# Patient Record
Sex: Female | Born: 1955
Health system: Southern US, Community
[De-identification: ages and names within clinical notes are randomized; demographics above are authoritative.]

## PROBLEM LIST (undated history)

## (undated) DIAGNOSIS — K5792 Diverticulitis of intestine, part unspecified, without perforation or abscess without bleeding: Secondary | ICD-10-CM

## (undated) DIAGNOSIS — K219 Gastro-esophageal reflux disease without esophagitis: Secondary | ICD-10-CM

## (undated) DIAGNOSIS — K589 Irritable bowel syndrome without diarrhea: Secondary | ICD-10-CM

## (undated) DIAGNOSIS — Z9109 Other allergy status, other than to drugs and biological substances: Secondary | ICD-10-CM

## (undated) HISTORY — PX: CHOLECYSTECTOMY: SHX55

## (undated) HISTORY — PX: FRACTURE SURGERY: SHX138

## (undated) HISTORY — PX: TONSILLECTOMY: SUR1361

## (undated) HISTORY — PX: JOINT REPLACEMENT: SHX530

## (undated) HISTORY — PX: ABDOMINAL HYSTERECTOMY: SHX81

## (undated) HISTORY — PX: BLADDER SURGERY: SHX569

## (undated) HISTORY — DX: Irritable bowel syndrome, unspecified: K58.9

---

## 1998-07-19 ENCOUNTER — Inpatient Hospital Stay (HOSPITAL_COMMUNITY): Admission: EM | Admit: 1998-07-19 | Discharge: 1998-07-21 | Payer: Self-pay | Admitting: Internal Medicine

## 1999-08-22 ENCOUNTER — Encounter: Admission: RE | Admit: 1999-08-22 | Discharge: 1999-08-22 | Payer: Self-pay | Admitting: Otolaryngology

## 1999-12-22 ENCOUNTER — Encounter: Payer: Self-pay | Admitting: Internal Medicine

## 1999-12-22 ENCOUNTER — Ambulatory Visit (HOSPITAL_COMMUNITY): Admission: RE | Admit: 1999-12-22 | Discharge: 1999-12-22 | Payer: Self-pay | Admitting: Internal Medicine

## 2001-05-10 ENCOUNTER — Encounter: Admission: RE | Admit: 2001-05-10 | Discharge: 2001-05-10 | Payer: Self-pay | Admitting: Urology

## 2001-05-10 ENCOUNTER — Encounter: Payer: Self-pay | Admitting: Urology

## 2001-10-16 ENCOUNTER — Encounter: Payer: Self-pay | Admitting: Internal Medicine

## 2001-10-16 ENCOUNTER — Ambulatory Visit (HOSPITAL_COMMUNITY): Admission: RE | Admit: 2001-10-16 | Discharge: 2001-10-16 | Payer: Self-pay | Admitting: Internal Medicine

## 2001-11-08 ENCOUNTER — Ambulatory Visit (HOSPITAL_COMMUNITY): Admission: RE | Admit: 2001-11-08 | Discharge: 2001-11-08 | Payer: Self-pay | Admitting: Surgery

## 2001-11-08 ENCOUNTER — Encounter: Payer: Self-pay | Admitting: Surgery

## 2002-06-10 ENCOUNTER — Encounter: Payer: Self-pay | Admitting: Internal Medicine

## 2002-06-10 ENCOUNTER — Ambulatory Visit (HOSPITAL_COMMUNITY): Admission: RE | Admit: 2002-06-10 | Discharge: 2002-06-10 | Payer: Self-pay | Admitting: Internal Medicine

## 2002-06-13 ENCOUNTER — Ambulatory Visit (HOSPITAL_COMMUNITY): Admission: RE | Admit: 2002-06-13 | Discharge: 2002-06-13 | Payer: Self-pay | Admitting: Internal Medicine

## 2002-06-13 ENCOUNTER — Encounter: Payer: Self-pay | Admitting: Internal Medicine

## 2002-07-07 ENCOUNTER — Encounter: Payer: Self-pay | Admitting: Emergency Medicine

## 2002-07-07 ENCOUNTER — Emergency Department (HOSPITAL_COMMUNITY): Admission: EM | Admit: 2002-07-07 | Discharge: 2002-07-07 | Payer: Self-pay | Admitting: Emergency Medicine

## 2003-12-18 ENCOUNTER — Ambulatory Visit (HOSPITAL_COMMUNITY): Admission: RE | Admit: 2003-12-18 | Discharge: 2003-12-18 | Payer: Self-pay | Admitting: Specialist

## 2003-12-18 ENCOUNTER — Ambulatory Visit (HOSPITAL_BASED_OUTPATIENT_CLINIC_OR_DEPARTMENT_OTHER): Admission: RE | Admit: 2003-12-18 | Discharge: 2003-12-18 | Payer: Self-pay | Admitting: Specialist

## 2004-02-11 ENCOUNTER — Inpatient Hospital Stay (HOSPITAL_COMMUNITY): Admission: AD | Admit: 2004-02-11 | Discharge: 2004-02-17 | Payer: Self-pay | Admitting: Family Medicine

## 2004-02-15 ENCOUNTER — Encounter: Payer: Self-pay | Admitting: Internal Medicine

## 2004-02-16 ENCOUNTER — Encounter (INDEPENDENT_AMBULATORY_CARE_PROVIDER_SITE_OTHER): Payer: Self-pay | Admitting: Specialist

## 2004-02-22 ENCOUNTER — Encounter: Admission: RE | Admit: 2004-02-22 | Discharge: 2004-02-22 | Payer: Self-pay | Admitting: Family Medicine

## 2005-05-30 ENCOUNTER — Emergency Department (HOSPITAL_COMMUNITY): Admission: EM | Admit: 2005-05-30 | Discharge: 2005-05-30 | Payer: Self-pay | Admitting: Emergency Medicine

## 2007-12-24 ENCOUNTER — Emergency Department (HOSPITAL_COMMUNITY): Admission: EM | Admit: 2007-12-24 | Discharge: 2007-12-24 | Payer: Self-pay | Admitting: Emergency Medicine

## 2007-12-26 ENCOUNTER — Encounter (INDEPENDENT_AMBULATORY_CARE_PROVIDER_SITE_OTHER): Payer: Self-pay | Admitting: General Surgery

## 2007-12-26 ENCOUNTER — Observation Stay (HOSPITAL_COMMUNITY): Admission: EM | Admit: 2007-12-26 | Discharge: 2007-12-27 | Payer: Self-pay | Admitting: Emergency Medicine

## 2007-12-28 ENCOUNTER — Inpatient Hospital Stay (HOSPITAL_COMMUNITY): Admission: EM | Admit: 2007-12-28 | Discharge: 2007-12-31 | Payer: Self-pay | Admitting: Emergency Medicine

## 2008-10-27 ENCOUNTER — Emergency Department (HOSPITAL_COMMUNITY): Admission: EM | Admit: 2008-10-27 | Discharge: 2008-10-28 | Payer: Self-pay | Admitting: Emergency Medicine

## 2008-11-04 ENCOUNTER — Emergency Department (HOSPITAL_COMMUNITY): Admission: EM | Admit: 2008-11-04 | Discharge: 2008-11-05 | Payer: Self-pay | Admitting: Emergency Medicine

## 2009-11-12 ENCOUNTER — Encounter: Payer: Self-pay | Admitting: Internal Medicine

## 2009-11-19 ENCOUNTER — Encounter (INDEPENDENT_AMBULATORY_CARE_PROVIDER_SITE_OTHER): Payer: Self-pay | Admitting: *Deleted

## 2009-11-19 ENCOUNTER — Encounter: Admission: RE | Admit: 2009-11-19 | Discharge: 2009-11-19 | Payer: Self-pay | Admitting: Obstetrics & Gynecology

## 2009-11-26 ENCOUNTER — Encounter (INDEPENDENT_AMBULATORY_CARE_PROVIDER_SITE_OTHER): Payer: Self-pay | Admitting: *Deleted

## 2009-12-20 ENCOUNTER — Encounter: Admission: RE | Admit: 2009-12-20 | Discharge: 2009-12-20 | Payer: Self-pay | Admitting: Otolaryngology

## 2010-01-04 ENCOUNTER — Encounter (INDEPENDENT_AMBULATORY_CARE_PROVIDER_SITE_OTHER): Payer: Self-pay | Admitting: *Deleted

## 2010-01-04 ENCOUNTER — Ambulatory Visit: Payer: Self-pay | Admitting: Internal Medicine

## 2010-01-04 DIAGNOSIS — R109 Unspecified abdominal pain: Secondary | ICD-10-CM

## 2010-01-04 DIAGNOSIS — R198 Other specified symptoms and signs involving the digestive system and abdomen: Secondary | ICD-10-CM

## 2010-01-25 ENCOUNTER — Ambulatory Visit: Payer: Self-pay | Admitting: Internal Medicine

## 2010-01-26 ENCOUNTER — Telehealth: Payer: Self-pay | Admitting: Internal Medicine

## 2010-10-10 ENCOUNTER — Ambulatory Visit: Payer: Self-pay | Admitting: Family Medicine

## 2010-10-10 DIAGNOSIS — M25569 Pain in unspecified knee: Secondary | ICD-10-CM

## 2010-10-11 ENCOUNTER — Encounter: Payer: Self-pay | Admitting: Family Medicine

## 2010-10-15 ENCOUNTER — Encounter
Admission: RE | Admit: 2010-10-15 | Discharge: 2010-10-15 | Payer: Self-pay | Source: Home / Self Care | Attending: Family Medicine | Admitting: Family Medicine

## 2010-10-16 ENCOUNTER — Emergency Department (HOSPITAL_BASED_OUTPATIENT_CLINIC_OR_DEPARTMENT_OTHER)
Admission: EM | Admit: 2010-10-16 | Discharge: 2010-10-16 | Payer: Self-pay | Source: Home / Self Care | Admitting: Emergency Medicine

## 2010-10-20 ENCOUNTER — Ambulatory Visit
Admission: RE | Admit: 2010-10-20 | Discharge: 2010-10-20 | Payer: Self-pay | Source: Home / Self Care | Attending: Orthopedic Surgery | Admitting: Orthopedic Surgery

## 2010-11-25 ENCOUNTER — Encounter
Admission: RE | Admit: 2010-11-25 | Discharge: 2010-11-25 | Payer: Self-pay | Source: Home / Self Care | Attending: Orthopedic Surgery | Admitting: Orthopedic Surgery

## 2010-12-06 NOTE — Assessment & Plan Note (Signed)
Summary: RT KNEE PAIN/NP/LP   Vital Signs:  Patient profile:   55 year old female Height:      63.5 inches (161.29 cm) Weight:      129.4 pounds (58.82 kg) BMI:     22.64 Temp:     98.1 degrees F (36.72 degrees C) oral Pulse rate:   68 / minute BP sitting:   108 / 71  (right arm)  Vitals Entered By: Baxter Hire) (October 10, 2010 10:56 AM) CC: right knee pain Pain Assessment Patient in pain? yes     Location: right knee Intensity: 9 Type: aching and sharp Onset of pain  knee pain for the past 2 months Nutritional Status BMI of 19 -24 = normal  Does patient need assistance? Functional Status Self care Ambulation Normal   Primary Care Provider:  Levander Campion, MD  CC:  right knee pain.  History of Present Illness: 55 yo F here with right knee pain  Patient reports no known injury Pain started about 2 months ago in medial aspect of knee No issues with left knee Pain worse with twisting motions, sharp pains at times just in this locqation No true locking Clicking within knee Has buckled occasionally Had weight bearing x-rays which showed mild medial DJD Has tried tylenol (no nsaids - has gerd and worsen this and irritate stomach), icing without much help Had cortisone injection into knee as well which did not help either Occasionally wakes her up at night. No prior knee injuries or surgeries  Habits & Providers  Alcohol-Tobacco-Diet     Tobacco Status: quit  Problems Prior to Update: 1)  Knee Pain, Right  (ICD-719.46) 2)  Abdominal Pain, Upper  (ICD-789.09) 3)  Change in Bowels  (ZOX-096.04)  Allergies: 1)  ! Penicillin 2)  ! Sulfa 3)  ! Demerol  Past History:  Past Medical History: Last updated: 01/04/2010 Insomnia Allergic Rhinitis Hiatal Hernia Anxiety Disorder Depression GERD Hyperlipidemia  Family History: Last updated: 01/04/2010 Family History of Heart Disease: Brother-MI age 67, Father-MI age 20 Family History of Colon  Polyps:mother No FH of Colon Cancer: Family History of Lung Cancer: Maternal Aunt (smoker)  Physical Exam  General:  Well developed, well nourished, no acute distress. Msk:  R knee: No gross deformity, swelling, or bruising. Moderate TTP medial joint line.  No TTP lateral joint line, pes insertion, elsewhere about knee. FROM but pain at full flexion Negative ant/post drawer, lachmanns Knee stable to valgus/varus stress Negative patellar apprehension + mcmurrays medially NVI distally   Impression & Recommendations:  Problem # 1:  KNEE PAIN, RIGHT (ICD-719.46) Assessment New Knee pain worse with twisting without an injury and not relieved by cortisone injection as well as + mcmurrays suggest patient has a degenerative medial meniscal tear.  Given conservative therapy has not worked up to this point including injection, she will likely need surgical intervention if tear is confirmed by MRI.  Order MRI to assess which will be done Saturday.  Will call patient with results.  In meantime given rx for vicodin for severe pain but no driving on this.  Has very little change in joint space medially to suggest arthritis as primary cause and these were weight-bearing films.  Home quad strengthening exercises, handout provided.  Her updated medication list for this problem includes:    Tylenol Extra Strength 500 Mg Tabs (Acetaminophen) .Marland Kitchen... Take as needed    Vicodin 5-500 Mg Tabs (Hydrocodone-acetaminophen) .Marland Kitchen... 1 tab by mouth two times a day as needed  severe pain  Orders: MRI without Contrast (MRI w/o Contrast)  Complete Medication List: 1)  Flonase 50 Mcg/act Susp (Fluticasone propionate) .... 2 sprays each nostril daily 2)  Chantix Continuing Month Pak 1 Mg Tabs (Varenicline tartrate) .... Take one by mouth two times a day 3)  Ambien Cr 12.5 Mg Cr-tabs (Zolpidem tartrate) .... Take one by mouth at bedtime 4)  Omeprazole 40 Mg Cpdr (Omeprazole) .... Take 1 tablet by mouth once a day 5)   Celexa 40 Mg Tabs (Citalopram hydrobromide) .... Take one by mouth once daily 6)  Prednisone 20 Mg Tabs (Prednisone) .... Take one tablet by mouth three times a day 7)  Klonopin 0.5 Mg Tabs (Clonazepam) .... Take one by mouth two times a day 8)  Vitamin D (ergocalciferol) 50000 Unit Caps (Ergocalciferol) .... Take one by mouth once a week 9)  Sea-omega 30 1200 Mg Caps (Omega-3 fatty acids) .... Take one by mouth two times a day 10)  Tylenol Extra Strength 500 Mg Tabs (Acetaminophen) .... Take as needed 11)  Hyomax-sr 0.375 Mg Xr12h-tab (Hyoscyamine sulfate) .Marland Kitchen.. 1 by mouth two times a day 12)  Vicodin 5-500 Mg Tabs (Hydrocodone-acetaminophen) .Marland Kitchen.. 1 tab by mouth two times a day as needed severe pain  Patient Instructions: 1)  While you have some mild arthritis, your symptoms are most likely from a degenerative meniscal tear (cartilage tear). 2)  We need to do an MRI to confirm this - if there is one, I will have you see one of my colleagues to discuss surgery. 3)  Continue icing your knee 15 minutes at a time 3-4 times a day. 4)  Elevate above the level of your heart when possible. 5)  A knee sleeve may help for support. 6)  Take pain medication up to twice a day as directed but no driving or working on this medication. 7)  I will call you with the MRI result when it comes back and how we are to proceed. Prescriptions: VICODIN 5-500 MG TABS (HYDROCODONE-ACETAMINOPHEN) 1 tab by mouth two times a day as needed severe pain  #30 x 0   Entered and Authorized by:   Norton Blizzard MD   Signed by:   Norton Blizzard MD on 10/10/2010   Method used:   Print then Give to Patient   RxID:   [id number]    Orders Added: 1)  MRI without Contrast [MRI w/o Contrast] 2)  New Patient Level III [[address]782]

## 2010-12-06 NOTE — Letter (Signed)
Summary: *Consult Note  [facility]  [address]   [place], [place] [address]   Phone: [phone]  Fax:     Re:    [name] DOB:    [birth year]   Dear Dr Luz Brazen:    Thank you for requesting that we see the above patient for consultation.  A copy of the detailed office note will be sent under separate cover, for your review.  Evaluation today is consistent with:  Right knee degenerative medial meniscal tear  Our recommendation is for:  MRI of knee given cortisone injection, tylenol, and icing has not helped to this point as would expect it to with arthritis Continue with icing Vicodin two times a day as needed severe pain Will call patient with MRI result - if tear confirmed, will refer for consideration of arthroscopy   After today's visit, the patients current medications include: 1)  FLONASE 50 MCG/ACT SUSP (FLUTICASONE PROPIONATE) 2 sprays each nostril daily 2)  CHANTIX CONTINUING MONTH PAK 1 MG TABS (VARENICLINE TARTRATE) take one by mouth two times a day 3)  AMBIEN CR 12.5 MG CR-TABS (ZOLPIDEM TARTRATE) take one by mouth at bedtime 4)  OMEPRAZOLE 40 MG CPDR (OMEPRAZOLE) Take 1 tablet by mouth once a day 5)  CELEXA 40 MG TABS (CITALOPRAM HYDROBROMIDE) take one by mouth once daily 6)  PREDNISONE 20 MG TABS (PREDNISONE) take one tablet by mouth three times a day 7)  KLONOPIN 0.5 MG TABS (CLONAZEPAM) take one by mouth two times a day 8)  VITAMIN D (ERGOCALCIFEROL) 50000 UNIT CAPS (ERGOCALCIFEROL) take one by mouth once a week 9)  SEA-OMEGA 30 1200 MG CAPS (OMEGA-3 FATTY ACIDS) take one by mouth two times a day 10)  TYLENOL EXTRA STRENGTH 500 MG TABS (ACETAMINOPHEN) take as needed 11)  HYOMAX-SR 0.375 MG XR12H-TAB (HYOSCYAMINE SULFATE) 1 by mouth two times a day 12)  VICODIN 5-500 MG TABS (HYDROCODONE-ACETAMINOPHEN) 1 tab by mouth two times a day as needed severe pain   Thank you for this consultation.  If you have any further questions regarding  the care of this patient, please do not hesitate to contact me @ [phone].  Thank you for this opportunity to look after your patient.  Sincerely,   Norton Blizzard MD

## 2010-12-06 NOTE — Letter (Signed)
Summary: New Patient letter  [facility]  [address] [place], [place] [address]   Phone: [phone]  Fax: [phone]       11/26/2009 MRN: [id number]  [facility] [place] [address] [place], [place]  [address]  Dear Ms. Reffett,  Welcome to the Gastroenterology Division at [facility].    You are scheduled to see Dr. Leone Payor on 01-04-10 at 8:45a.m. on the 3rd floor at [facility], [address]  We ask that you try to arrive at our office 15 minutes prior to your appointment time to allow for check-in.  We would like you to complete the enclosed self-administered evaluation form prior to your visit and bring it with you on the day of your appointment.  We will review it with you.  Also, please bring a complete list of all your medications or, if you prefer, bring the medication bottles and we will list them.  Please bring your insurance card so that we may make a copy of it.  If your insurance requires a referral to see a specialist, please bring your referral form from your primary care physician.  Co-payments are due at the time of your visit and may be paid by cash, check or credit card.     Your office visit will consist of a consult with your physician (includes a physical exam), any laboratory testing he/she may order, scheduling of any necessary diagnostic testing (e.g. x-ray, ultrasound, CT-scan), and scheduling of a procedure (e.g. Endoscopy, Colonoscopy) if required.  Please allow enough time on your schedule to allow for any/all of these possibilities.    If you cannot keep your appointment, please call [phone] to cancel or reschedule prior to your appointment date.  This allows [place] the opportunity to schedule an appointment for another patient in need of care.  If you do not cancel or reschedule by 5 p.m. the business day prior to your appointment date, you will be charged a $50.00 late cancellation/no-show fee.    Thank you for choosing  [facility] for your medical needs.  We appreciate the opportunity to care for you.  Please visit [place] at our website  to learn more about our practice.                     Sincerely,                                                             The Gastroenterology Division

## 2010-12-06 NOTE — Procedures (Signed)
Summary: Colonoscopy: Dr. Randa Evens   Colonoscopy  Procedure date:  02/15/2004  Findings:      Location:  [facility]  Performed by Dr. Carman Ching  NAME:  [name], [name]                            ACCOUNT NO.:  [id number]   MEDICAL RECORD NO.:  [id number]                   PATIENT TYPE:  INP   LOCATION:  5731                                 FACILITY:  [facility]   PHYSICIAN:  Llana Aliment. Malon Kindle., M.D.          DATE OF BIRTH:  04/03/1956   DATE OF PROCEDURE:  02/15/2004  DATE OF DISCHARGE:                                 OPERATIVE REPORT   PROCEDURE:  Colonoscopy and biopsy.   MEDICATIONS:  Fentanyl 90 mcg, Versed 9 mg IV.   INDICATIONS:  Abdominal pain, diarrhea, with CT scan showing a possible  thickening of the sigmoid colon.   DESCRIPTION OF PROCEDURE:  The patient had been prepped with some Miralax  and tap water enemas.  The scope was inserted and we advanced easily to the  cecum.  The ileocecal valve and appendiceal orifice were seen.  There was no  solid stool.  The mucosa throughout the colon revealed some diffuse edema  and granularity, in some places worse than others.  Multiple biopsies were  obtained.  It did seem a little bit friable.  There were no ulcerations or  inflammation.  This process actually extended throughout the entire colon.  There were no other findings in the sigmoid colon, no significant  diverticular disease.  The rectum was free of lesions.  There were no polyps  seen.   ASSESSMENT:  Possible mild colitis of questionable cause.   PLAN:  Will check the results of all the studies to date, including  pathology reports of these biopsies.  Will go ahead and feed the patient and  see how she does clinically.                                               James L. Malon Kindle., M.D.    [name]  D:  02/15/2004  T:  02/16/2004  Job:  [id number]   cc:   Leighton Roach McDiarmid, M.D.  Fax: [phone]  This report was created from the original  endoscopy report, which was reviewed and signed by the above listed endoscopist.

## 2010-12-06 NOTE — Letter (Signed)
Summary: New Patient letter  [facility]  [address] [place], [place] [address]   Phone: [phone]  Fax: [phone]       11/19/2009 MRN: [id number]  [facility] [place] [address] [place], [place]  [address]  Dear Ms. Sedlacek,  Welcome to the Gastroenterology Division at [facility].    You are scheduled to see Dr. Leone Payor on 12-14-09 at 10:30a.m. on the 3rd floor at [facility], [address].  We ask that you try to arrive at our office 15 minutes prior to your appointment time to allow for check-in.  We would like you to complete the enclosed self-administered evaluation form prior to your visit and bring it with you on the day of your appointment.  We will review it with you.  Also, please bring a complete list of all your medications or, if you prefer, bring the medication bottles and we will list them.  Please bring your insurance card so that we may make a copy of it.  If your insurance requires a referral to see a specialist, please bring your referral form from your primary care physician.  Co-payments are due at the time of your visit and may be paid by cash, check or credit card.     Your office visit will consist of a consult with your physician (includes a physical exam), any laboratory testing he/she may order, scheduling of any necessary diagnostic testing (e.g. x-ray, ultrasound, CT-scan), and scheduling of a procedure (e.g. Endoscopy, Colonoscopy) if required.  Please allow enough time on your schedule to allow for any/all of these possibilities.    If you cannot keep your appointment, please call [phone] to cancel or reschedule prior to your appointment date.  This allows [place] the opportunity to schedule an appointment for another patient in need of care.  If you do not cancel or reschedule by 5 p.m. the business day prior to your appointment date, you will be charged a $50.00 late cancellation/no-show fee.    Thank you for  choosing [facility] for your medical needs.  We appreciate the opportunity to care for you.  Please visit [place] at our website  to learn more about our practice.                     Sincerely,                                                             The Gastroenterology Division

## 2010-12-06 NOTE — Progress Notes (Signed)
Summary: triage  Phone Note Call from Patient Call back at Home Phone [phone]   Caller: Patient Call For: Leone Payor Reason for Call: Talk to Nurse Summary of Call: Patient wants to speak to nurse because she is having a lot of problems with her stomach, states that she had a meal last night and feels really bad after that. Initial call taken by: Tawni Levy,  January 26, 2010 10:22 AM  Follow-up for Phone Call        P t called stating she has felt "bad" since eating after procedure. Has a burning type pain- a little crampy. When asked she had a chickfila sandwick with french fries after procedure. She has had a banana this am and was unsure if she felt better. Refered to diet suggestions on D/c sheet and encouraged her to make wise choices- if not better after next meal to call us back Follow-up by: Oda Cogan RN,  January 26, 2010 2:20 PM

## 2010-12-06 NOTE — Procedures (Signed)
Summary: Upper Endoscopy  Patient: [name] Note: All result statuses are Final unless otherwise noted.  Tests: (1) Upper Endoscopy (EGD)   EGD Upper Endoscopy       DONE     [facility]     [address]     [place], [place]  [address]           ENDOSCOPY PROCEDURE REPORT           PATIENT:  [name], [name]  MR#:  [id number]     BIRTHDATE:  June 13, 1956, 53 yrs. old  GENDER:  female           ENDOSCOPIST:  Iva Boop, MD, [facility]     Referred by:  Levander Campion, M.D.           PROCEDURE DATE:  01/25/2010     PROCEDURE:  EGD with biopsy     ASA CLASS:  Class II     INDICATIONS:  abdominal pain, multiple sites upper           MEDICATIONS:   Benadryl 25 mg IV, Versed 8 mg IV, Fentanyl 100 mcg     IV     TOPICAL ANESTHETIC:  Exactacain Spray           DESCRIPTION OF PROCEDURE:   After the risks benefits and     alternatives of the procedure were thoroughly explained, informed     consent was obtained.  The LB GIF-H180 [id number] endoscope was     introduced through the mouth and advanced to the second portion of     the duodenum, without limitations.  The instrument was slowly     withdrawn as the mucosa was fully examined.     <<PROCEDUREIMAGES>>           Mild gastritis was found. Erythema and mottled mucosa in the     antrum. Multiple biopsies were obtained and sent to pathology.     Otherwise the examination was normal.    Retroflexed views revealed     no abnormalities.    The scope was then withdrawn from the patient     and the procedure completed.           COMPLICATIONS:  None           ENDOSCOPIC IMPRESSION:     1) Mild gastritis - biopsied     2) Otherwise normal examination     RECOMMENDATIONS:     1) Await biopsy results     2) Colonoscopy next           REPEAT EXAM:  as needed           Iva Boop, MD, [facility]           CC:  Levander Campion MD, Annamaria Helling, MD, The Patient           n.     Rosalie Doctor:   Iva Boop at 01/25/2010 03:36  PM           Alva Garnet, [id number]  Note: An exclamation mark (!) indicates a result that was not dispersed into the flowsheet. Document Creation Date: 01/25/2010 3:37 PM _______________________________________________________________________  (1) Order result status: Final Collection or observation date-time: 01/25/2010 15:05 Requested date-time:  Receipt date-time:  Reported date-time:  Referring Physician:   Ordering Physician: Stan Head [phone]) Specimen Source:  Source: Launa Grill Order Number: [id number] Lab site:

## 2010-12-06 NOTE — Procedures (Signed)
Summary: Colonoscopy  Patient: [name] Note: All result statuses are Final unless otherwise noted.  Tests: (1) Colonoscopy (COL)   COL Colonoscopy           DONE     [facility]     [address]     [place], [place]  [address]           COLONOSCOPY PROCEDURE REPORT           PATIENT:  Kristin, Caldwell  MR#:  [id number]     BIRTHDATE:  1956/05/21, 53 yrs. old  GENDER:  female           ENDOSCOPIST:  Iva Boop, MD, [facility]     Referred by:  Levander Campion, M.D.           PROCEDURE DATE:  01/25/2010     PROCEDURE:  Colonoscopy 213[address]     ASA CLASS:  Class II     INDICATIONS:  change in bowel habits           MEDICATIONS:   There was residual sedation effect present from     prior procedure., Versed 2 mg IV           DESCRIPTION OF PROCEDURE:   After the risks benefits and     alternatives of the procedure were thoroughly explained, informed     consent was obtained.  Digital rectal exam was performed and     revealed no abnormalities.   The LB CF-H180AL [id number] endoscope     was introduced through the anus and advanced to the cecum, which     was identified by both the appendix and ileocecal valve, without     limitations.  The quality of the prep was adequate, using     MoviPrep.  The instrument was then slowly withdrawn as the colon     was fully examined.     Insertion: 5:49 minutes Withdrawal: 15:41 minutes     <<PROCEDUREIMAGES>>           FINDINGS:  Moderate diverticulosis was found in the sigmoid colon.     This was otherwise a normal examination of the colon.     Retroflexed views in the rectum revealed no abnormalities.    The     scope was then withdrawn from the patient and the procedure     completed.           COMPLICATIONS:  None           ENDOSCOPIC IMPRESSION:     1) Moderate diverticulosis in the sigmoid colon     2) Otherwise normal examination     RECOMMENDATIONS:     Continue MiraLax and use every day if needed to provide regular  bowel movements, adjusting dose for effect (example 1/2 dose daily     or 2 doses daily to provide desired effects)           I will notify her re: EGD biospies and plans.           REPEAT EXAM:  In 10 year(s) for routine screening colonocsopy.           Iva Boop, MD, [facility]           CC:  Levander Campion MD, Annamaria Helling, MD, The Patient           n.     Rosalie Doctor:   Iva Boop at 01/25/2010 03:44 PM  Ltanya, [place], [phone]  Note: An exclamation mark (!) indicates a result that was not dispersed into the flowsheet. Document Creation Date: 01/25/2010 3:45 PM _______________________________________________________________________  (1) Order result status: Final Collection or observation date-time: 01/25/2010 15:32 Requested date-time:  Receipt date-time:  Reported date-time:  Referring Physician:   Ordering Physician: Stan Head [phone]) Specimen Source:  Source: Launa Grill Order Number: [id number] Lab site:   Appended Document: Colonoscopy    Clinical Lists Changes  Observations: Added new observation of COLONNXTDUE: 01/2020 (01/25/2010 16:07)

## 2010-12-06 NOTE — Assessment & Plan Note (Signed)
Summary: Change in bowels, upper abdominal pain   History of Present Illness Visit Type: Initial Consult Primary GI MD: Stan Head MD [facility] Primary Provider: Levander Campion, MD Requesting Provider: Levander Campion, MD Chief Complaint: Patient referred for consult for a colonoscopy, she is having come constipation which she relates to her Prednisone. She states she has some hemorrhoids but denies any blood in her stool.  History of Present Illness:   55 yo white woman that has noticed a change in her bowels with constipation since she quit somking. May be mre narrow. PCP reocmmended MiraLax and it helped using it as needed once every 3-4 days. She was defecating every day then went to every 3-4 days since quitting smoking. + associated bloating, gets relief with defecation. When she strains a hemorrhoid swells.   She has had belching, bloating and red meat intolerance (pain in upper abdomen) since cholecystectomy 3 years ago. Spicy foods also give her abdomnal pain ("I will wind up in emergency room". Was on Nexium with help but not not total relief and went to omeprazole due to cost. Uppper abdomnal pain and tendeernes since chole.  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 MRI negative. Seeing Dr. Pollyann Kennedy. Has been on Prednisone since 2/18 20 mg three times a day.      GI Review of Systems    Reports acid reflux, belching, and  bloating.      Denies abdominal pain, chest pain, dysphagia with liquids, dysphagia with solids, heartburn, loss of appetite, nausea, vomiting, vomiting blood, weight loss, and  weight gain.      Reports change in bowel habits, constipation, and  hemorrhoids.    Preventive Screening-Counseling & Management  Alcohol-Tobacco     Smoking Status: quit    Current Medications (verified): 1)  Flonase 50 Mcg/act Susp (Fluticasone Propionate) .... 2 Sprays Each Nostril Daily 2)  Chantix Continuing Month Pak 1 Mg Tabs (Varenicline Tartrate)  .... Take One By Mouth Two Times A Day 3)  Ambien Cr 12.5 Mg Cr-Tabs (Zolpidem Tartrate) .... Take One By Mouth At Bedtime 4)  Omeprazole 40 Mg Cpdr (Omeprazole) .... Take 1 Tablet By Mouth Once A Day 5)  Celexa 40 Mg Tabs (Citalopram Hydrobromide) .... Take One By Mouth Once Daily 6)  Prednisone 20 Mg Tabs (Prednisone) .... Take One Tablet By Mouth Three Times A Day 7)  Klonopin 0.5 Mg Tabs (Clonazepam) .... Take One By Mouth Two Times A Day 8)  Vitamin D (Ergocalciferol) 50000 Unit Caps (Ergocalciferol) .... Take One By Mouth Once A Week 9)  Sea-Omega 30 1200 Mg Caps (Omega-3 Fatty Acids) .... Take One By Mouth Two Times A Day 10)  Tylenol Extra Strength 500 Mg Tabs (Acetaminophen) .... Take As Needed  Allergies (verified): 1)  ! Penicillin 2)  ! Sulfa 3)  ! Demerol  Past History:  Past Medical History: Insomnia Allergic Rhinitis Hiatal Hernia Anxiety Disorder Depression GERD Hyperlipidemia  Past Surgical History: Ankle Reconstruction Back Surgery BSO Cholecystectomy - stones Hysterectomy Tonsillectomy  Family History: Family History of Heart Disease: Brother-MI age 6, Father-MI age 29 Family History of Colon Polyps:mother No FH of Colon Cancer: Family History of Lung Cancer: Maternal Aunt (smoker)  Social History: Single, 2 Children Alcohol Use - yes 1 glass wine/week Illicit Drug Use - no Patient does not get regular exercise.  Patient is a former smoker.  Daily Caffeine Use couple cups coffee Smoking Status:  quit  Review of Systems  The patient complains of allergy/sinus, back pain, headaches-new, hearing problems, night sweats, sleeping problems, and urine leakage.         chronic anxiety adn panic, depression All other ROS negative except as per HPI.   Vital Signs:  Patient profile:   55 year old female Height:      63.5 inches Weight:      123.6 pounds BMI:     21.63 Pulse rate:   68 / minute Pulse rhythm:   regular BP sitting:   108 /  58  (left arm) Cuff size:   regular  Vitals Entered By: Harlow Mares CMA Duncan Dull) (January 04, 2010 8:38 AM)  Physical Exam  General:  Well developed, well nourished, no acute distress. Eyes:  PERRLA, no icterus. Mouth:  No deformity or lesions, dentition normal. Neck:  Supple; no masses or thyromegaly. Lungs:  Clear throughout to auscultation. Heart:  Regular rate and rhythm; no murmurs, rubs,  or bruits. Abdomen:  mildly tender RUQ, soft and NT otherwise without HSM or mass Rectal:  deferred until time of colonoscopy.   Extremities:  No clubbing, cyanosis, edema or deformities noted. Skin:  tattoos lower extremities Cervical Nodes:  No significant cervical or supraclavicular adenopathy.  Psych:  Alert and cooperative. Normal mood and affect.   Impression & Recommendations:  Problem # 1:  CHANGE IN BOWELS (UJW-119.14) Assessment New New constipation that she associates with quiting smoking, at least temporally. She had a colonoscopy with random bxcs for abdominal painand thickened sigmoid colon on CT([facility]) 2005 that showed edema and granularity of colonic mucosa but biopsies did not reveal a disease provess.This bowel habit changeneeds further evaluation and a diagnostic colonoscopy is indicated, which I have explained (as  opposed to screening). ? new mass lesion, ? diverticulosis, ? IBS I have reviewed Dr. Myna Hidalgo office notes from recent visit. Risks, benefits,and indications of endoscopic procedure(s) were reviewed with the patient and all questions answered.  colonoscopy  Orders: Colon/Endo (Colon/Endo)  Problem # 2:  ABDOMINAL PAIN, UPPER (ICD-789.09) Assessment: New Her upper abdominal pain could be GERD but has not reall;y responded to PPI and may be worse or different since cholecystectomy. Also tender over the RUQ area. EGD is appropriate to assess. Pending those results consider a trial of pancreatic enzyme supplements (? fat intolerance), or  anti-spasmodics. Risks, benefits,and indications of endoscopic procedure(s) were reviewed with the patient and all questions answered.  Orders: Colon/Endo (Colon/Endo)  Patient Instructions: 1)  We will see you at your procedure on  01/25/10. 2)  Please pick up your medications at your pharmacy. MOVIPREP 3)  [facility] Patient Information Guide given to patient.  4)  Colonoscopy and Flexible Sigmoidoscopy brochure given.  5)  Upper Endoscopy brochure given.  6)  Copy sent to : Annamaria Helling, MD, Levander Campion, MD 7)  The medication list was reviewed and reconciled.  All changed / newly prescribed medications were explained.  A complete medication list was provided to the patient / caregiver. Prescriptions: MOVIPREP 100 GM  SOLR (PEG-KCL-NACL-NASULF-NA ASC-C) As per prep instructions.  #1 x 0   Entered by:   Francee Piccolo CMA (AAMA)   Authorized by:   Iva Boop MD, [facility]   Signed by:   Francee Piccolo CMA (AAMA) on 01/04/2010   Method used:   Electronically to        [facility] [address] (retail)       [phone]       [place]  [place], [place]  [address]       Ph: [phone]       Fax: [phone]   RxID:   [id number]

## 2010-12-06 NOTE — Letter (Signed)
Summary: Levander Campion MD  Levander Campion MD   Imported By: Lester Troy 01/11/2010 10:56:53  _____________________________________________________________________  External Attachment:    Type:   Image     Comment:   External Document

## 2010-12-06 NOTE — Letter (Signed)
Summary: [facility] Instructions  [facility]  [address] [place], [place] [address]   Phone: [phone]  Fax: [phone]       MYSTIQUE BJELLAND    Sep 20, 1956    MRN: [id number]        Procedure Day Dorna Bloom: Jake Shark, 01/25/10     Arrival Time: 1:00 PM      Procedure Time: 2:00 PM     Location of Procedure:                    _X_  [facility] (4th Floor)                       PREPARATION FOR COLONOSCOPY WITH MOVIPREP   Starting 5 days prior to your procedure 01/20/10 do not eat nuts, seeds, popcorn, corn, beans, peas,  salads, or any raw vegetables.  Do not take any fiber supplements (e.g. Metamucil, Citrucel, and Benefiber).  THE DAY BEFORE YOUR PROCEDURE         DATE: 01/24/10       DAY: MONDAY  1.  Drink clear liquids the entire day-NO SOLID FOOD  2.  Do not drink anything colored red or purple.  Avoid juices with pulp.  No orange juice.  3.  Drink at least 64 oz. (8 glasses) of fluid/clear liquids during the day to prevent dehydration and help the prep work efficiently.  CLEAR LIQUIDS INCLUDE: Water Jello Ice Popsicles Tea (sugar ok, no milk/cream) Powdered fruit flavored drinks Coffee (sugar ok, no milk/cream) Gatorade Juice: apple, white grape, white cranberry  Lemonade Clear bullion, consomm, broth Carbonated beverages (any kind) Strained chicken noodle soup Hard Candy                             4.  In the morning, mix first dose of MoviPrep solution:    Empty 1 Pouch A and 1 Pouch B into the disposable container    Add lukewarm drinking water to the top line of the container. Mix to dissolve    Refrigerate (mixed solution should be used within 24 hrs)  5.  Begin drinking the prep at 5:00 p.m. The MoviPrep container is divided by 4 marks.   Every 15 minutes drink the solution down to the next mark (approximately 8 oz) until the full liter is complete.   6.  Follow completed prep with 16 oz of clear liquid of your choice (Nothing  red or purple).  Continue to drink clear liquids until bedtime.  7.  Before going to bed, mix second dose of MoviPrep solution:    Empty 1 Pouch A and 1 Pouch B into the disposable container    Add lukewarm drinking water to the top line of the container. Mix to dissolve    Refrigerate  THE DAY OF YOUR PROCEDURE      DATE: 01/25/10      DAY: TUESDAY  Beginning at 9:00 a.m. (5 hours before procedure):         1. Every 15 minutes, drink the solution down to the next mark (approx 8 oz) until the full liter is complete.  2. Follow completed prep with 16 oz. of clear liquid of your choice.    3. You may drink clear liquids until 12:00 NOON (2 HOURS BEFORE PROCEDURE).   MEDICATION INSTRUCTIONS  Unless otherwise instructed, you should take regular prescription medications with a small sip of water  as early as possible the morning of your procedure.  Diabetic patients - see separate instructions.  Stop taking Plavix or Aggrenox on  _  _  (7 days before procedure).     Stop taking Coumadin on  _ _  (5 days before procedure).  Additional medication instructions: _         OTHER INSTRUCTIONS  You will need a responsible adult at least 56 years of age to accompany you and drive you home.   This person must remain in the waiting room during your procedure.  Wear loose fitting clothing that is easily removed.  Leave jewelry and other valuables at home.  However, you may wish to bring a book to read or  an iPod/MP3 player to listen to music as you wait for your procedure to start.  Remove all body piercing jewelry and leave at home.  Total time from sign-in until discharge is approximately 2-3 hours.  You should go home directly after your procedure and rest.  You can resume normal activities the  day after your procedure.    The day of your procedure you should not:   Drive   Make legal decisions   Operate machinery   Drink alcohol   Return to work  You will  receive specific instructions about eating, activities and medications before you leave.    The above instructions have been reviewed and explained to me by   [facility], CMA    I fully understand and can verbalize these instructions _____________________________ Date _________

## 2011-01-16 LAB — POCT HEMOGLOBIN-HEMACUE: Hemoglobin: 13.4 g/dL (ref 12.0–15.0)

## 2011-01-22 ENCOUNTER — Emergency Department (INDEPENDENT_AMBULATORY_CARE_PROVIDER_SITE_OTHER): Payer: Managed Care, Other (non HMO)

## 2011-01-22 ENCOUNTER — Emergency Department (HOSPITAL_BASED_OUTPATIENT_CLINIC_OR_DEPARTMENT_OTHER)
Admission: EM | Admit: 2011-01-22 | Discharge: 2011-01-22 | Disposition: A | Discharge status: Home / Self Care | Payer: Managed Care, Other (non HMO) | Attending: Emergency Medicine | Admitting: Emergency Medicine

## 2011-01-22 DIAGNOSIS — M545 Low back pain, unspecified: Secondary | ICD-10-CM | POA: Insufficient documentation

## 2011-01-22 DIAGNOSIS — K219 Gastro-esophageal reflux disease without esophagitis: Secondary | ICD-10-CM | POA: Insufficient documentation

## 2011-01-22 DIAGNOSIS — M549 Dorsalgia, unspecified: Secondary | ICD-10-CM

## 2011-01-22 DIAGNOSIS — M79609 Pain in unspecified limb: Secondary | ICD-10-CM | POA: Insufficient documentation

## 2011-01-22 DIAGNOSIS — E785 Hyperlipidemia, unspecified: Secondary | ICD-10-CM | POA: Insufficient documentation

## 2011-01-22 LAB — URINALYSIS, ROUTINE W REFLEX MICROSCOPIC
Bilirubin Urine: NEGATIVE
Glucose, UA: NEGATIVE mg/dL
Hgb urine dipstick: NEGATIVE
Ketones, ur: NEGATIVE mg/dL
Specific Gravity, Urine: 1.014 (ref 1.005–1.030)
Urobilinogen, UA: 0.2 mg/dL (ref 0.0–1.0)
pH: 7.5 (ref 5.0–8.0)

## 2011-03-21 NOTE — Op Note (Signed)
NAMENOHEA, KRAS                  ACCOUNT NO.:  [id number]   MEDICAL RECORD NO.:  [id number]          PATIENT TYPE:  OBV   LOCATION:  0098                         FACILITY:  [facility]   PHYSICIAN:  Anselm Pancoast. Weatherly, M.D.DATE OF BIRTH:  1956-11-06   DATE OF PROCEDURE:  12/26/2007  DATE OF DISCHARGE:  12/27/2007                               OPERATIVE REPORT   PREOPERATIVE DIAGNOSIS:  Chronic cholecystitis with stones.   POSTOPERATIVE DIAGNOSIS:  Chronic cholecystitis with stones.   OPERATION:  Laparoscopic cholecystectomy with cholangiogram.   ANESTHESIA:  General anesthesia.   SURGEON:  Anselm Pancoast. Zachery Dakins, M.D.   ASSISTANT:  Leonie Man, M.D.   HISTORY:  [name] is a 55 year old female who has had recurrent  episodes of upper abdominal pain, was seen in the emergency room two  nights ago at [facility].  Her mother was hospitalized at the  time with end stage emphysema. Her mother died yesterday.  She was  recommended to see about having something done but because of her  mother's deteriorating condition, she declined, but pain became more  intense today and she came to the emergency room here. She is not  febrile, her white count is 5000, and she does appear uncomfortable.  She is on Vicodin and Phenergan.  I was asked to see her and talked with  the OR and elected to go ahead and proceed with surgery tonight.  Her  mother's funeral is on Saturday.  The patient was in agreement with this  plan.   DESCRIPTION OF PROCEDURE:  She was given 400 mg Cipro and emergent  penicillin and taken to the operative suite.  She had EKG and chest x-  ray which were reviewed by the anesthesiologist.  The abdomen was kind  of scaphoid. She had endotracheal tube and oral tube to the stomach.  The abdomen was prepped with Betadine solution and draped in a sterile  manner.  A small incision was made below the umbilicus.  She had a  little transverse incision made vertically and  the fascia was picked up  and then we carefully entered, picking up the posterior rectus fascia  which was picked up and a pursestring suture placed through both layers.  A Hassan cannula was introduced.  The gallbladder was distended but it  was not acutely inflamed.  There were adhesions around it.  The upper 10  mm trocar was placed in the subxiphoid area.  Dr. Lurene Shadow placed the  lateral 5 mm trocars at the appropriate lateral position and this is  right above adhesions from her previous sympathectomy.  The gallbladder  was then retracted upward and the adhesions with the omentum were taken  down carefully and then the proximal portion of the gallbladder was  identified.  The artery was first dissected from the cystic duct, doubly  clipped proximally, singly distally, and divided and I encompassed the  cystic duct where it junctioned with the gallbladder.  I placed a clip  across this junction and a small opening made proximally.  A Cook  catheter was introduced and held  in place with a clip. The x-ray was  obtained and it was kind of a dilated common bile duct but we could see  a good tapering flow going into the duodenum and I expect this is  related the narcotics she has received over the last 48 hours.  The  radiologist reviewed it while we were still in the operating room and he  thinks it is unremarkable.  The catheter had been removed.  There was a  little back pressure. Three clips were placed across the cystic duct and  the duct divided. The gallbladder was then freed from its bed and I  placed one additional clip across a little area that may be a posterior  branch and there was good hemostasis.  I placed the gallbladder in an  EndoCatch bag and withdrew the gallbladder out of the umbilical port.  I  then placed an additional figure-of-eight suture of Vicryl in the  umbilicus, tied both it and the pursestring, and then anesthetized with  Marcaine.  I placed a stitch in the  upper 10 mm trocar as we could see  into the peritoneal cavity, also of 0 Vicryl, and then the subcutaneous  wounds were closed with 4-0 Vicryl, Benzoin and Steri-Strips on the  skin.  The patient tolerated the procedure and hopefully will be able to  be discharged in the a.m.           ______________________________  Anselm Pancoast. Zachery Dakins, M.D.     WJW/MEDQ  D:  12/26/2007  T:  12/27/2007  Job:  [id number]

## 2011-03-21 NOTE — H&P (Signed)
NAMEBREEZIE, [name]                  ACCOUNT NO.:  [id number]   MEDICAL RECORD NO.:  [id number]          PATIENT TYPE:  OBV   LOCATION:  0098                         FACILITY:  [facility]   PHYSICIAN:  Anselm Pancoast. Weatherly, M.D.DATE OF BIRTH:  22-Aug-1956   DATE OF ADMISSION:  12/26/2007  DATE OF DISCHARGE:  12/27/2007                              HISTORY & PHYSICAL   PREOPERATIVE DIAGNOSIS:  Gallstones, abdominal pain.   HISTORY:  [name] is a 55 year old Caucasian female who presented to  the emergency room at [facility] today with the following history.  She  has had 2 or 3 days history of epigastric pain, right upper quadrant,  was seen in the emergency room two nights ago at [facility].  Her  mother was hospitalized time with end-stage emphysema, and the patient  was found to have gallstones and recommended that she called our office  for appointment.  She did not, but she was given nausea medicine and  pain medication, had more pain.  Her mother died yesterday, and she came  to the emergency room today here and was seen by the ER physician.   LABORATORY STUDIES:  Laboratory studies were performed, and her white  count were normal.  Liver function studies were normal, but she appeared  to be in pretty significant pain and they called me and wanted to know  if there is any way we could take care of the problem.   PAST MEDICAL HISTORY:  She has had problems with reconstruction of the  right ankle where she had a sympathectomy some time in the past and had  a GYN tubal ligation.  I discussed to the OR about could we go ahead and  do her night.  She was in agreement with this plan, as her mother's  funeral is Saturday.  Pre-operatively, she was given 400 mg of Cipro in  preparation for surgery.   ALLERGIES:  SHE HAD AN ALLERGIC REACTION TO REGLAN IN THE EMERGENCY  ROOM.  SHE SAID SHE HAD LOCK JAWS.   REVIEW OF SYSTEMS:  The patient, as far as review of systems, she does   smoke.   MEDICATIONS:  She is on Phenergan, Ranitidine, and hydrocodone and  Celexa.   PHYSICAL EXAM:  GENERAL:  She is a small, petite female that is little  older than stated age and appears to be comfortable.  VITAL SIGNS:  Temperature  was 93, pulse 68, respirations were 20 and  blood pressure is 124/72.  HEENT:  Eyes, ears, nose and throat unremarkable.  LUNGS:  Clear.  CARDIAC:  Normal sinus rhythm.  ABDOMEN:  Kind of a small abdomen, has appendectomy incision, lateral to  the umbilicus on the right.  No abdominal masses, and she is slightly  tender in the right upper quadrant.  Did not do a pelvic exam.  Her x-  rays showed stones within the gallbladder, but she is distended, and it  is a little thickened.   ADMISSION IMPRESSION:  Symptomatic gallstones.   PLAN:  Will proceed with laparoscopic cholecystectomy this evening, and  hopefully  she will be able to be discharged tomorrow.           ______________________________  Anselm Pancoast. Zachery Dakins, M.D.     WJW/MEDQ  D:  12/26/2007  T:  12/27/2007  Job:  [id number]

## 2011-03-24 NOTE — H&P (Signed)
[name], [name]                            ACCOUNT NO.:  [id number]   MEDICAL RECORD NO.:  [id number]                   PATIENT TYPE:  INP   LOCATION:  5731                                 FACILITY:  [facility]   PHYSICIAN:  Billey Gosling, M.D.                 DATE OF BIRTH:  09-03-1956   DATE OF ADMISSION:  02/11/2004  DATE OF DISCHARGE:                                HISTORY & PHYSICAL   CHIEF COMPLAINT:  Left lower quadrant pain, nausea and vomiting.   HISTORY OF PRESENT ILLNESS:  This 55 year old white female with a history of  depression, anxiety, and peptic ulcer disease presented to [facility] on [facility] 06, 2004, with complaints of left lower quadrant pain, but no  nausea, vomiting, or diarrhea.  Labs were obtained and were normal, and an  acute abdominal series was obtained and was reportedly normal.  The patient  presented today at [facility] with a two day history of nausea and  vomiting .which is three times per day and diarrhea which is non-bloody and  approximately three loose stools per day.  She continues to have persistent  left lower quadrant pain that is sharp and non-radiating, and she is unable  to take p.o., and also states she has had subjective fever, but denies  hematochezia.  She has not had any recent travel or been on recent  antibiotics except she was given Flagyl and Cipro from [facility],  but she was unable to keep it down secondary to the vomiting.  She denies  having sick contacts, and has not had similar pain in the past.   REVIEW OF SYSTEMS:  See HPI, as well as positive for headache and  lightheadedness, but no chest pain, shortness of breath, or dysuria,  weakness, vision changes, or rashes.   PAST MEDICAL HISTORY:  1. Peptic ulcer disease.  2. Depression.  3. Anxiety.   PAST SURGICAL HISTORY:  1. Total abdominal hysterectomy with BSO.  2. Ankle surgery on the right.  3. Right shoulder arthroscopy.    MEDICATIONS:  1. Xanax.  2. Effexor.  (The patient started these two medications two weeks ago, but has not taken  secondary to the vomiting).   ALLERGIES:  1. PENICILLIN.  2. NSAIDS.  Both cause GI upset.   FAMILY HISTORY:  Mother has COPD.  Father deceased secondary to a MI at age  41.  Grandmother with pancreatic cancer.  No diabetes, diverticulosis, or  rheumatoid arthritis runs in the family.   SOCIAL HISTORY:  The patient lives with her mother.  She is divorced.  She  has two children who are 48 and 79 years of age.  She works  for_____________and smokes 4 to 5 cigarettes per day x2 years.  She denies  alcohol or drug use.   PHYSICAL EXAMINATION:  VITAL SIGNS:  Temperature 98.1, pulse 85,  respirations  18, blood pressure laying 123/80, sitting 125/76, standing  125/85, weight 147 pounds.  GENERAL:  The patient appears in discomfort, but is non-toxic and in no  acute distress.  She has a very flat affect, but is oriented x3.  HEENT:  Pupils equal, round, reactive to light.  Extraocular movements were  intact.  Nares without discharge.  Oropharynx without erythema or exudate.  Dry mucous membranes.  NECK:  No lymphadenopathy, supple, no thyromegaly.  CARDIOVASCULAR:  Regular rate and rhythm without murmurs, rubs, or gallops.  LUNGS:  Clear to auscultation bilaterally with good effort.  BACK:  No CVA tenderness.  ABDOMEN:  Soft, normoactive bowel sounds, nondistended.  Two horizontal  excoriations adjacent to the umbilicus, tender left lower  quadrant/suprapubic area, no rebound, some voluntary guarding present.  EXTREMITIES:  Poor skin turgor and capillary refill 2 to 3 seconds.  Ulnar  deviation of bilateral hands present, but no nodules noted.  No lower  extremity edema, and 2+ pedal pulses bilaterally.  NEUROLOGIC:  Cranial nerves II-XII intact.  Finger-to-nose test within  normal limits.  Strength is 5/5 bilaterally, and deep tendon reflexes are 3+  and brisk  bilaterally.  RECTAL:  Brown stool and heme negative per Dr. Cleta Alberts.   LABORATORY DATA:  White blood cell count 5.4 and 8.2 on February 06, 2004.  Hemoglobin 13, hematocrit 39.5, platelets 248.  Labs on February 06, 2004,  showed sodium 139, potassium 3.8, chloride 102, bicarbonate 22, BUN 11,  creatinine 0.9, glucose 89.  LFTs were within normal limits.  Amylase was  44, lipase was 22.  An acute abdominal series on February 06, 2004, was negative  per Dr. Cleta Alberts.   ASSESSMENT AND PLAN:  A 55 year old white female with peptic ulcer disease,  depression, now presents with left lower quadrant pain, nausea and vomiting,  and diarrhea.  1. Left lower quadrant pain:  Appears consistent with diverticulitis, but no     leukocytosis or documented fever and guaiac negative.  We will obtain CT     scan of the abdomen to rule out diverticulitis versus obstruction versus     mass.  Doubt GYN etiology since the patient is status post total     abdominal hysterectomy with bilateral salpingo-oophorectomy.  She may     need Cipro and Flagyl depending on the CT scan results.  2. Nausea, vomiting, diarrhea:  May be gastroenteritis versus diverticulitis     versus infectious causes.  We will check stool studies and check a BMP to     replace electrolytes if needed.  3. Fluids, electrolytes, and nutrition:  Moderate dehydration by clinical     examination, but vital signs are stable and she is not orthostatic.     Start 1/2 normal saline plus 20 mEq of potassium at 250 cc per hour,     monitor I's and O's.     Also check a BMP to rule out electrolyte abnormalities.  4. Depression/anxiety:  Hold medications until taking p.o.  5. History of peptic ulcer disease:  Currently stable.  Will start Protonix.                                                Billey Gosling, M.D.    AS/MEDQ  D:  02/11/2004  T:  02/11/2004  Job:  [id number]   cc:   Brett Canales A.  Cleta Alberts, M.D.  [address]  [place] [address]  Fax: [phone]

## 2011-03-24 NOTE — Discharge Summary (Signed)
NAME:  [name], [name]                            ACCOUNT NO.:  [id number]   MEDICAL RECORD NO.:  [id number]                   PATIENT TYPE:  INP   LOCATION:  5731                                 FACILITY:  [facility]   PHYSICIAN:  Santiago Bumpers. Hensel, M.D.             DATE OF BIRTH:  [birth year]   DATE OF ADMISSION:  02/11/2004  DATE OF DISCHARGE:  02/17/2004                                 DISCHARGE SUMMARY   DISCHARGE DIAGNOSES:  1. Questionable infectious colitis.  2. Depression.  3. Peptic ulcer disease.   DISCHARGE MEDICATIONS:  1. Darvocet 1 p.o. q.4-6 h. p.r.n. pain.  2. Phenergan 1/2 to 1 tablet p.o. q.6 h. p.r.n. nausea.  3. Protonix 40 mg 1 p.o. daily.  4. Wellbutrin 150 mg 1 p.o. daily.   DISPOSITION:  Discharge is to home.   FOLLOWUP:  Followup will be with Ursula Beath, M.D. on Wednesday, Mar 16, 2004, at 2 p.m. and I will set up a GI followup if her symptoms continue  after the interceding months.   CONSULTATIONS:  James L. Randa Evens, M.D., gastroenterology.   PROCEDURES:  1. Colonoscopy and biopsy showing possible mild colitis of questionable     cause.  Please note that biopsies were obtained and should be followed up     as an outpatient.  2. Abdominal ultrasound showing mild gallbladder sludge noted but no     evidence of gallstones or biliary dilatation.  3. CT of the abdomen and pelvis cannot exclude bowel wall thickening and     small bowel loops in the left mid-abdomen, although these changes may be     secondary to under-distention by contrast.  A right paraspinal surgical     clip is questionable, prior node dissection, no other abnormalities in     pelvis, questionable sigmoid wall thickening, nonspecific.  If the     patient's symptoms persist, consider a colonoscopy.   HISTORY OF PRESENT ILLNESS:  This was a 55 year old white female with a  history of depression, anxiety and peptic ulcer disease, presenting [facility] on [facility], 2004  with complaints of left lower quadrant  pain, but no nausea, vomiting or diarrhea.  Labs were obtained and were  normal and an acute abdominal series was obtained and was also reportedly  normal.  The patient presented to urgent medical care the day of admission  with a 2-day history of nausea and vomiting and diarrhea which is non-bloody  and approximately 3 loose stools per day.  She continues to have persistent  left lower quadrant pain that is sharp and non-radiating.  She is unable to  take p.o. and states she has subjective fever, but denies hematochezia.  She  has not had any recent travel or been on recent antibiotics, except she was  given Flagyl and Cipro at urgent [facility] but was unable to keep it down  secondary  to vomiting.  She denies having sick contacts and has not had  similar pain in the past.  Please refer to the dictated history and physical  for complete H&P.   HOSPITAL COURSE:  PROBLEM #1 - INFECTIOUS COLITIS:  The patient was started  on pain control, antiemetics and underwent the previously mentioned studies  with little information obtained.  She was able to tolerate p.o. at  discharge and it is felt that her symptoms were probably exacerbated by her  symptoms of depression.   PROBLEM #2 - DEPRESSION:  She was started on Wellbutrin and will be  discharged with this medication for followup with Dr. Ursula Beath.   PROBLEM #3 - PEPTIC ULCER DISEASE:  She was started on Protonix during her  hospitalization and will continue this as an outpatient.      Ursula Beath, MD                     Santiago Bumpers. Leveda Anna, M.D.    JT/MEDQ  D:  02/17/2004  T:  02/18/2004  Job:  [id number]   cc:   Fayrene Fearing L. Malon Kindle., M.D.  [address]  [place]  [place] [address]  Fax: [phone]

## 2011-03-24 NOTE — Op Note (Signed)
NAME:  [name], [name]                            ACCOUNT NO.:  [id number]   MEDICAL RECORD NO.:  [id number]                   PATIENT TYPE:  INP   LOCATION:  5731                                 FACILITY:  [facility]   PHYSICIAN:  James L. Malon Kindle., M.D.          DATE OF BIRTH:  08/21/1956   DATE OF PROCEDURE:  02/15/2004  DATE OF DISCHARGE:                                 OPERATIVE REPORT   PROCEDURE:  Colonoscopy and biopsy.   MEDICATIONS:  Fentanyl 90 mcg, Versed 9 mg IV.   INDICATIONS:  Abdominal pain, diarrhea, with CT scan showing a possible  thickening of the sigmoid colon.   DESCRIPTION OF PROCEDURE:  The patient had been prepped with some Miralax  and tap water enemas.  The scope was inserted and we advanced easily to the  cecum.  The ileocecal valve and appendiceal orifice were seen.  There was no  solid stool.  The mucosa throughout the colon revealed some diffuse edema  and granularity, in some places worse than others.  Multiple biopsies were  obtained.  It did seem a little bit friable.  There were no ulcerations or  inflammation.  This process actually extended throughout the entire colon.  There were no other findings in the sigmoid colon, no significant  diverticular disease.  The rectum was free of lesions.  There were no polyps  seen.   ASSESSMENT:  Possible mild colitis of questionable cause.   PLAN:  Will check the results of all the studies to date, including  pathology reports of these biopsies.  Will go ahead and feed the patient and  see how she does clinically.                                               James L. Malon Kindle., M.D.    [name]  D:  02/15/2004  T:  02/16/2004  Job:  [id number]   cc:   Leighton Roach McDiarmid, M.D.  Fax: [phone]

## 2011-03-24 NOTE — Op Note (Signed)
NAMEZALIAH, [name]                            ACCOUNT NO.:  [id number]   MEDICAL RECORD NO.:  [id number]                   PATIENT TYPE:  AMB   LOCATION:  DSC                                  FACILITY:  [facility]   PHYSICIAN:  Jene Every, M.D.                 DATE OF BIRTH:  [birth year]   DATE OF PROCEDURE:  12/18/2003  DATE OF DISCHARGE:                                 OPERATIVE REPORT   PREOPERATIVE DIAGNOSIS:  Adhesive capsulitis, impingement syndrome, possible  rotator cuff tear, right shoulder.   POSTOPERATIVE DIAGNOSIS:  Adhesive capsulitis, impingement syndrome,  possible rotator cuff tear, right shoulder.   PROCEDURE PERFORMED:  Manipulation under anesthesia, examination under  anesthesia, followed by right shoulder glenohumeral arthroscopy, followed by  subacromial arthroscopy, decompression acromioplasty and bursectomy.   ANESTHESIA:  General.   SURGEON:  Jene Every, M.D.   ASSISTANT:  Roma Schanz, P.A.   BRIEF HISTORY AND INDICATIONS:  This is a 55 year old with refractory  shoulder pain.  MRI indicated rotator cuff arthropathy.  The patient  demonstrated adhesive capsulitis.  Operative intervention was indicated for  manipulation, subacromial decompression, possible open rotator cuff repair.  The risks and benefits were discussed including bleeding, infection, damage  to neurovascular structures, no change in symptoms, worsening in symptoms,  etc.   SURGICAL TECHNIQUE:  With the patient in the beach chair position, after  adequate general anesthesia and 500 mg vancomycin, the right shoulder and  upper extremity were prepped and draped in the usual sterile fashion.  Just  prior to the prepping and draping, exam under anesthesia showed only limited  abduction to 80 degrees of abduction and forward flexion to 80 degrees, as  well.  With gentle manipulative procedure, grasping the humerus proximally,  I elevated her in forward flexion with audible and palpable  lysis of  adhesions and was able to get her to 130 degrees of forward flexion, I was  also able to abduct her to 120 degrees of abduction as equivalent to the  contralateral side.  We were able to abduct and forward flex to reach behind  her head with her forearm.  Her internal and external rotation was  essentially unremarkable and equivalent to the contralateral side.   Next, I delineated the acromion with a marker after prepping and draping,  injected the portals with 0.25% Marcaine with epinephrine.  Standard  posterolateral portal was made through the skin only.  An anterior portal  based between the coracoid and the anterolateral aspect of the acromion  closer to the acromion 1/3 of its way after penetrating the capsule  atraumatically posteriorly with the arm in 70/30 position and traction  applied.  We penetrated it atraumatically and irrigation was utilized to  insufflate the joint.  I inspected the full joint with no evidence of  rotator cuff tear, biceps and labrum were unremarkable, no SLAP tear.  I  advanced a cannula from the anterior portal into the glenohumeral joint and  lavaged the joint.  There was no evidence of pathology requiring  intervention.  The glenoid was mild degenerative changes and mild  degenerative changes on the humeral head.  Next, I redirected the camera in  the subacromial space.  I placed an anterolateral portal through the skin  only and advanced a blunt cannula into the subacromial space.  Noted was  exuberant hypertrophic bursal tissue throughout the subacromial space and a  shaver was introduced and utilized to perform a full bursectomy that was  completed with the Arthro Wand.  The CA ligament was detached from the  anterolateral aspect of the acromion, we skeletonized the anterolateral  aspect of the acromion and performed a full bursectomy.  There was mild  fraying of the rotator cuff.  With full internal and external rotation,  there was no  evidence of a tear, it was probed and there was no evidence of  a tear, as well.  After release of the CA ligament, there was a small hook  over the anterolateral aspect of the acromion and a bur was introduced  through the anterolateral and utilized to perform an acromioplasty.  Following this, there was no residual impingement of the rotator cuff, no  tear, good range of motion, no bleeding.  I then removed all instrumentation  after evacuation of the arthroscopic fluid and closed the portals with 4-0  nylon simple sutures.  The wound was dressed sterilely and secured with  paper tape.  She was placed in a sling, extubated without difficulty, and  transported to the recovery room in satisfactory condition.  The patient  tolerated the procedure well, there were no complications.                                               Jene Every, M.D.    Cordelia Pen  D:  12/18/2003  T:  12/18/2003  Job:  [id number]

## 2011-03-24 NOTE — Consult Note (Signed)
NAME:  [name], [name]                            ACCOUNT NO.:  [id number]   MEDICAL RECORD NO.:  [id number]                   PATIENT TYPE:  INP   LOCATION:  5731                                 FACILITY:  [facility]   PHYSICIAN:  James L. Malon Kindle., M.D.          DATE OF BIRTH:  1956-10-13   DATE OF CONSULTATION:  02/13/2004  DATE OF DISCHARGE:                                   CONSULTATION   REQUESTING PHYSICIAN:  Billey Gosling, M.D. for Leighton Roach McDiarmid, M.D.   HISTORY:  A 55 year old white female who had been well up until  approximately one week ago. At that point in time, she began to have nausea,  vomiting, diarrhea, and cramping abdominal pain. [facility].  Labs were normal.  Acute abdominal series  was obtained and reportedly normal.  She returned after she did not get any  better, with nausea and vomiting, three to four loose, nonbloody bowel  movements a day with nocturnal stools which had been going on for about  three days prior to her admission.  She continued to have persistent pain  located in the left lower quadrant.  She was unable to take anything p.o.,  felt warm but was not febrile, had no hematochezia.  She has not traveled  recently.  She had been on Cipro and Flagyl from her [facility]  about one week, but prior to this has not been on any other antibiotics.  She did have right shoulder arthroscopy several weeks ago, but the operative  note seen on chart did not show any antibiotics.  Currently the patient  notes her abdomen is cramping.  She is having a lot of gas and continuing to  have loose stools.  She feels nauseated but overall feels like she might be  a bit better.  She has received IV Protonix, Flagyl, Cipro, as well as  Zofran since her admission.   A CT scan was obtained showing a thickening of the sigmoid colon but no  clear diverticular disease.  We are asked to see her with this history.   MEDICATIONS ON ADMISSION:  Xanax, Effexor, Cipro, and Flagyl.   ALLERGIES:  PENICILLIN and VARIOUS NONSTEROIDAL DRUGS.   PERSONAL HISTORY:  She has a history of previous hysterectomy, bilateral  salpingo-oophorectomy.  She had ankle surgery and recent shoulder surgery.  She has had a history of ulcers, history of chronic depression with anxiety.   FAMILY HISTORY:  Remarkable for parents who had no cancer, but grandmother  did have pancreatitis cancer.  Father died of a heart attack at 62.  Mother  had COPD.   REVIEW OF SYSTEMS:  Primarily as mentioned.   SOCIAL HISTORY:  The patient lives with her mother; she is divorced, has two  children.  She is a smoker, denies alcohol.   PHYSICAL EXAMINATION:  VITAL SIGNS:  The patient is afebrile.  T-max. on  admission 99.2, blood pressure 110/61.  GENERAL:  Alert white female in no acute distress.  HEENT:  Sclerae not icteric.  NECK:  Supple with no lymphadenopathy.  LUNGS:  Clear.  HEART: Regular rate and rhythm without murmurs or gallops.  ABDOMEN:  Mild diffuse tenderness.  Good bowel sounds.  Tenderness is worse  in left lower quadrant.   PERTINENT LABORATORY AND X-RAY DATA:  All cultures were pending.  White  count 4.1.   IMPRESSION:  Acute illness of one week duration manifested by nausea,  vomiting, and diarrhea with thickened colon on CT scan.  This could be  inflammatory bowel disease or infection.   PLAN:  Will go ahead and check the results of the cultures. Will check a C.  difficile toxin, white blood cells from urine and stool.  I think she ought  to have at least flexible sigmoidoscopy on Monday to evaluate this area  endoscopically and rule out inflammatory bowel disease.  I would continue  the Cipro and Flagyl.                                               James L. Malon Kindle., M.D.    [name]  D:  02/13/2004  T:  02/14/2004  Job:  [id number]   cc:   Billey Gosling, M.D.  [address] [place], [place] [address]   Fax: [phone]   Leighton Roach McDiarmid, M.D.  Fax: [phone]

## 2011-03-24 NOTE — Discharge Summary (Signed)
Kristin, Caldwell                  ACCOUNT NO.:  [id number]   MEDICAL RECORD NO.:  [id number]          PATIENT TYPE:  INP   LOCATION:  1530                         FACILITY:  [facility]   PHYSICIAN:  Anselm Pancoast. Weatherly, M.D.DATE OF BIRTH:  [birth year]   DATE OF ADMISSION:  12/28/2007  DATE OF DISCHARGE:  12/31/2007                               DISCHARGE SUMMARY   DISCHARGE DIAGNOSES:  1. Status post recent urgent laparoscopic cholecystectomy with      cholangiogram.  2. Atelectasis versus possible early pneumonia, right lower lobe.   SURGERY:  None.   HISTORY:  [name] is a 55 year old female who presented to the [facility]  emergency room on February 17 when her mother was hospitalized with end-  stage COPD and died the following day. She was actually seen in the [facility]  emergency department where she was visiting when she had a severe  episode of right upper quadrant gastric pain.  She was seen in the ER.  Ultrasound of the gallbladder obtained.  Liver studies and white cell  count were normal.  Because of her mother's impending death, she wanted  to wait.  She was given instructions to call our office to be seen for  symptomatic gallstones.  Her mother died the following day.  The patient  was grieved by that and then had another episode of similar pain and  came to the emergency room at [facility].  I was seeing her, being on  call for the emergency room on Thursday evening.   On physician examination, she was not febrile or an acutely surgical  abdomen, but since this was the second episode, she had the gallstones,  and her mother's funeral was in 2 days,  we recommended we go ahead and  proceed on with a laparoscopic cholecystectomy and cholangiogram, and  she could be ready for discharge the following day.  We did that, and  then the following day she had problems basically with hiccups.  The  etiology of this was unsure.  She required pain medications for this,  but the hiccups  appeared to subside, and she was ready to be discharged  as we had originally planned.  However, on Saturday, the day of her  mother's funeral, she thought she was having more pain and presented  back and was seen by Dr. Corliss Skains who was on call for our group.  He  readmitted her. White count was normal at 8600.  Liver function studies  were all normal.  Since we were not sure exactly what was going on other  than this was a grief reaction with the hiccups, a PIPIDA scan and then  ultimately CT were performed.  The patient had a little atelectasis of  the right middle lobe.  There was no evidence of any leak or any  abnormality of the operative area.   Her white count the following morning was mildly elevated at 13,100, and  Dr. Corliss Skains went ahead and started her on Unasyn.  Her liver tests had a  bump.  The total bilirubin was normal, but her  SGOT ws 239 where on the  previous day had been 63, and the SGOT, which had been 116, was 239.  She felt better.  On the following day, I saw her at which time she was  talking and kind of emotionally distraught as much as the pain I really  think, but we elected to go ahead and continue the Unasyn, repeated the  liver tests, and repeated a chest x-ray on February 23.  That showed a  little congestion.  The patient is a smoker, and we elected to keep her  one additional day and then discharge her on February 24 with a Z-Pak.   Her abdomen is no longer tender.  The hiccups have resolved, and whether  this was a mild atelectasis or early pneumonia, we were not sure,  but  we did not think there were any complications of the surgery itself.  She will be seen in our office for a followup in approximately 1 week.  She was tolerating a regular diet at that time.           ______________________________  Anselm Pancoast. Zachery Dakins, M.D.     WJW/MEDQ  D:  01/06/2008  T:  01/06/2008  Job:  [id number]

## 2011-04-30 ENCOUNTER — Emergency Department (HOSPITAL_BASED_OUTPATIENT_CLINIC_OR_DEPARTMENT_OTHER)
Admission: EM | Admit: 2011-04-30 | Discharge: 2011-04-30 | Disposition: A | Discharge status: Home / Self Care | Payer: Managed Care, Other (non HMO) | Attending: Emergency Medicine | Admitting: Emergency Medicine

## 2011-04-30 DIAGNOSIS — E785 Hyperlipidemia, unspecified: Secondary | ICD-10-CM | POA: Insufficient documentation

## 2011-04-30 DIAGNOSIS — N760 Acute vaginitis: Secondary | ICD-10-CM | POA: Insufficient documentation

## 2011-04-30 DIAGNOSIS — F329 Major depressive disorder, single episode, unspecified: Secondary | ICD-10-CM | POA: Insufficient documentation

## 2011-04-30 DIAGNOSIS — K219 Gastro-esophageal reflux disease without esophagitis: Secondary | ICD-10-CM | POA: Insufficient documentation

## 2011-04-30 DIAGNOSIS — L02219 Cutaneous abscess of trunk, unspecified: Secondary | ICD-10-CM | POA: Insufficient documentation

## 2011-04-30 DIAGNOSIS — F3289 Other specified depressive episodes: Secondary | ICD-10-CM | POA: Insufficient documentation

## 2011-04-30 DIAGNOSIS — F17200 Nicotine dependence, unspecified, uncomplicated: Secondary | ICD-10-CM | POA: Insufficient documentation

## 2011-04-30 LAB — WET PREP, GENITAL: Trich, Wet Prep: NONE SEEN

## 2011-05-02 LAB — GC/CHLAMYDIA PROBE AMP, GENITAL: Chlamydia, DNA Probe: NEGATIVE

## 2011-07-28 LAB — DIFFERENTIAL
Basophils Absolute: 0
Eosinophils Absolute: 0
Eosinophils Relative: 1
Neutrophils Relative %: 38 — ABNORMAL LOW

## 2011-07-28 LAB — I-STAT 8, (EC8 V) (CONVERTED LAB)
BUN: 7
Bicarbonate: 25.1 — ABNORMAL HIGH
Glucose, Bld: 108 — ABNORMAL HIGH
Hemoglobin: 13.6
Operator id: 272551
Sodium: 136
TCO2: 26
pCO2, Ven: 29.5 — ABNORMAL LOW

## 2011-07-28 LAB — HEPATIC FUNCTION PANEL
ALT: 19
AST: 42 — ABNORMAL HIGH
Alkaline Phosphatase: 54
Total Protein: <3 — ABNORMAL LOW

## 2011-07-28 LAB — CBC
HCT: 37.6
MCV: 91.6
Platelets: 97 — ABNORMAL LOW
RDW: 12.7

## 2011-07-28 LAB — POCT I-STAT CREATININE: Creatinine, Ser: 0.9

## 2011-07-31 LAB — CBC
HCT: 32.1 — ABNORMAL LOW
HCT: 32.5 — ABNORMAL LOW
HCT: 32.6 — ABNORMAL LOW
HCT: 37.8
HCT: 38.4
Hemoglobin: 11.1 — ABNORMAL LOW
Hemoglobin: 11.4 — ABNORMAL LOW
Hemoglobin: 13.1
Hemoglobin: 13.4
MCHC: 34.5
MCHC: 34.7
MCHC: 34.7
MCHC: 35
MCHC: 35
MCV: 90.2
MCV: 90.6
MCV: 90.9
MCV: 91.2
MCV: 91.5
Platelets: 120 — ABNORMAL LOW
Platelets: 139 — ABNORMAL LOW
Platelets: 153
Platelets: 166
Platelets: 186
RBC: 3.51 — ABNORMAL LOW
RBC: 3.58 — ABNORMAL LOW
RBC: 4.16
RBC: 4.26
RDW: 12.6
RDW: 12.7
RDW: 12.9
RDW: 13
RDW: 13
WBC: 5
WBC: 6.5
WBC: 6.6
WBC: 8.6

## 2011-07-31 LAB — COMPREHENSIVE METABOLIC PANEL WITH GFR
ALT: 120 — ABNORMAL HIGH
ALT: 17
ALT: 75 — ABNORMAL HIGH
AST: 20
AST: 420 — ABNORMAL HIGH
AST: 63 — ABNORMAL HIGH
Albumin: 3.2 — ABNORMAL LOW
Albumin: 3.8
Albumin: 3.8
Alkaline Phosphatase: 115
Alkaline Phosphatase: 116
Alkaline Phosphatase: 57
BUN: 11
BUN: 7
BUN: 9
CO2: 23
CO2: 26
CO2: 28
Calcium: 8.6
Calcium: 9.4
Calcium: 9.4
Chloride: 107
Chloride: 109
Chloride: 109
Creatinine, Ser: 0.72
Creatinine, Ser: 0.73
Creatinine, Ser: 0.75
GFR calc non Af Amer: >60
GFR calc non Af Amer: >60
GFR calc non Af Amer: >60
Glucose, Bld: 109 — ABNORMAL HIGH
Glucose, Bld: 110 — ABNORMAL HIGH
Glucose, Bld: 148 — ABNORMAL HIGH
Potassium: 4.1
Potassium: 4.4
Potassium: 4.8
Sodium: 140
Sodium: 141
Sodium: 142
Total Bilirubin: 0.7
Total Bilirubin: 0.7
Total Bilirubin: 1.3 — ABNORMAL HIGH
Total Protein: 6.3
Total Protein: 7.4
Total Protein: 7.4

## 2011-07-31 LAB — DIFFERENTIAL
Basophils Absolute: 0.1
Basophils Absolute: 0.1
Basophils Relative: 1
Eosinophils Absolute: 0.1
Eosinophils Relative: 1
Lymphocytes Relative: 32
Lymphs Abs: 1.2
Lymphs Abs: 1.6
Monocytes Absolute: 0.7
Monocytes Absolute: 0.9
Monocytes Relative: 10
Monocytes Relative: 13 — ABNORMAL HIGH
Neutro Abs: 2.5
Neutrophils Relative %: 53

## 2011-07-31 LAB — LIPASE, BLOOD
Lipase: 22
Lipase: 23

## 2011-07-31 LAB — URINALYSIS, ROUTINE W REFLEX MICROSCOPIC
Bilirubin Urine: NEGATIVE
Bilirubin Urine: NEGATIVE
Glucose, UA: NEGATIVE
Glucose, UA: NEGATIVE
Hgb urine dipstick: NEGATIVE
Hgb urine dipstick: NEGATIVE
Ketones, ur: NEGATIVE
Nitrite: NEGATIVE
Protein, ur: NEGATIVE
Specific Gravity, Urine: 1.008
Specific Gravity, Urine: 1.013
Urobilinogen, UA: 0.2
pH: 7
pH: 8.5 — ABNORMAL HIGH

## 2011-07-31 LAB — COMPREHENSIVE METABOLIC PANEL
Albumin: 3 — ABNORMAL LOW
BUN: 6
Calcium: 8.4
Creatinine, Ser: 0.77
Total Protein: 6.4

## 2011-07-31 LAB — INFLUENZA A+B VIRUS AG-DIRECT(RAPID)
Inflenza A Ag: NEGATIVE
Influenza B Ag: NEGATIVE

## 2011-08-11 LAB — COMPREHENSIVE METABOLIC PANEL
AST: 16 U/L (ref 0–37)
AST: 18 U/L (ref 0–37)
Albumin: 3.5 g/dL (ref 3.5–5.2)
Albumin: 3.9 g/dL (ref 3.5–5.2)
Alkaline Phosphatase: 53 U/L (ref 39–117)
BUN: 9 mg/dL (ref 6–23)
CO2: 26 mEq/L (ref 19–32)
Calcium: 9.8 mg/dL (ref 8.4–10.5)
Chloride: 109 mEq/L (ref 96–112)
Creatinine, Ser: 0.79 mg/dL (ref 0.4–1.2)
GFR calc Af Amer: >60 mL/min (ref >60)
GFR calc Af Amer: >60 mL/min (ref >60)
GFR calc non Af Amer: >60 mL/min (ref >60)
Potassium: 3.5 mEq/L (ref 3.5–5.1)
Total Bilirubin: 0.4 mg/dL (ref 0.3–1.2)
Total Protein: 6.7 g/dL (ref 6.0–8.3)

## 2011-08-11 LAB — CBC
HCT: 40.1 % (ref 36.0–46.0)
MCHC: 33.9 g/dL (ref 30.0–36.0)
MCV: 92.3 fL (ref 78.0–100.0)
Platelets: 145 10*3/uL — ABNORMAL LOW (ref 150–400)
Platelets: 159 10*3/uL (ref 150–400)
RDW: 13.1 % (ref 11.5–15.5)
WBC: 4.8 10*3/uL (ref 4.0–10.5)

## 2011-08-11 LAB — DIFFERENTIAL
Basophils Absolute: 0.1 10*3/uL (ref 0.0–0.1)
Basophils Relative: 2 % — ABNORMAL HIGH (ref 0–1)
Eosinophils Absolute: 0.2 10*3/uL (ref 0.0–0.7)
Eosinophils Relative: 3 % (ref 0–5)
Eosinophils Relative: 4 % (ref 0–5)
Lymphocytes Relative: 22 % (ref 12–46)
Lymphocytes Relative: 38 % (ref 12–46)
Lymphs Abs: 1.9 10*3/uL (ref 0.7–4.0)
Monocytes Absolute: 0.6 10*3/uL (ref 0.1–1.0)
Neutro Abs: 2.1 10*3/uL (ref 1.7–7.7)
Neutro Abs: 5.4 10*3/uL (ref 1.7–7.7)

## 2011-08-11 LAB — POCT CARDIAC MARKERS
CKMB, poc: <1 ng/mL — ABNORMAL LOW (ref 1.0–8.0)
CKMB, poc: <1 ng/mL — ABNORMAL LOW (ref 1.0–8.0)
Myoglobin, poc: 47 ng/mL (ref 12–200)
Troponin i, poc: <0.05 ng/mL (ref 0.00–0.09)

## 2011-08-11 LAB — OCCULT BLOOD X 1 CARD TO LAB, STOOL: Fecal Occult Bld: NEGATIVE

## 2011-08-11 LAB — LIPASE, BLOOD: Lipase: 22 U/L (ref 11–59)

## 2011-09-08 ENCOUNTER — Encounter: Payer: Self-pay | Admitting: *Deleted

## 2011-09-08 ENCOUNTER — Emergency Department (HOSPITAL_BASED_OUTPATIENT_CLINIC_OR_DEPARTMENT_OTHER)
Admission: EM | Admit: 2011-09-08 | Discharge: 2011-09-08 | Disposition: A | Discharge status: Home / Self Care | Payer: Managed Care, Other (non HMO) | Attending: Emergency Medicine | Admitting: Emergency Medicine

## 2011-09-08 DIAGNOSIS — K219 Gastro-esophageal reflux disease without esophagitis: Secondary | ICD-10-CM | POA: Insufficient documentation

## 2011-09-08 DIAGNOSIS — F17200 Nicotine dependence, unspecified, uncomplicated: Secondary | ICD-10-CM | POA: Insufficient documentation

## 2011-09-08 DIAGNOSIS — J3489 Other specified disorders of nose and nasal sinuses: Secondary | ICD-10-CM | POA: Insufficient documentation

## 2011-09-08 DIAGNOSIS — Z79899 Other long term (current) drug therapy: Secondary | ICD-10-CM | POA: Insufficient documentation

## 2011-09-08 DIAGNOSIS — J029 Acute pharyngitis, unspecified: Secondary | ICD-10-CM | POA: Insufficient documentation

## 2011-09-08 DIAGNOSIS — R0981 Nasal congestion: Secondary | ICD-10-CM

## 2011-09-08 HISTORY — DX: Gastro-esophageal reflux disease without esophagitis: K21.9

## 2011-09-08 MED ORDER — DEXAMETHASONE SODIUM PHOSPHATE 10 MG/ML IJ SOLN
10.0000 mg | Freq: Once | INTRAMUSCULAR | Status: AC
  Administered 2011-09-08: 10 mg via INTRAMUSCULAR
  Filled 2011-09-08: qty 1

## 2011-09-08 MED ORDER — FLUTICASONE PROPIONATE 50 MCG/ACT NA SUSP: 1.0000 | Freq: Once | NASAL | Status: DC

## 2011-09-08 MED ORDER — FLUTICASONE PROPIONATE 50 MCG/ACT NA SUSP: 2.0000 | Freq: Three times a day (TID) | NASAL | Status: DC

## 2011-09-08 NOTE — ED Notes (Signed)
Pt c/o cold symptoms x 2 days

## 2011-09-08 NOTE — ED Provider Notes (Signed)
History     CSN: [id number] Arrival date & time: 09/08/2011  7:53 PM   First MD Initiated Contact with Patient 09/08/11 1955      Chief Complaint  Patient presents with  . URI    (Consider location/radiation/quality/duration/timing/severity/associated sxs/prior treatment) HPI She notes the gradual onset 3 days ago of congestion. Since onset she has felt progressively on comfortable. She notes no relief with Tylenol. Symptoms seem to be worse in the evening. No associated chest pain, nor shortness of breath. She does complain of sore throat, particularly with swallowing. No fever, no chills. Past Medical History  Diagnosis Date  . GERD (gastroesophageal reflux disease)     Past Surgical History  Procedure Date  . Abdominal hysterectomy   . Joint replacement   . Cholecystectomy   . Tonsillectomy     History reviewed. No pertinent family history.  History  Substance Use Topics  . Smoking status: Current Everyday Smoker -- 0.5 packs/day  . Smokeless tobacco: Not on file  . Alcohol Use: No    OB History    Grav Para Term Preterm Abortions TAB SAB Ect Mult Living                  Review of Systems Gen: Per HPI HEENT: No HA CV: No CP Resp: No dyspnea Abd: Per HPI, otherwise negative Musk: Per HPI, otherwise negative Neuro: No dysesthesia, or focal changes GU: Per HPI, otherwise negative Skin: Neg Psych: Neg  Allergies  Penicillins; Sulfonamide derivatives; and Meperidine hcl  Home Medications   Current Outpatient Rx  Name Route Sig Dispense Refill  . ACETAMINOPHEN 500 MG PO TABS Oral Take 1,000 mg by mouth 3 (three) times daily as needed. For pain    . CITALOPRAM HYDROBROMIDE 40 MG PO TABS Oral Take 40 mg by mouth daily.      Marland Kitchen CLONAZEPAM 0.5 MG PO TABS Oral Take 0.5 mg by mouth 2 (two) times daily.     Marland Kitchen FLUTICASONE PROPIONATE 50 MCG/ACT NA SUSP Nasal 2 sprays by Nasal route daily. Each nostril.     Marland Kitchen HYDROCODONE-ACETAMINOPHEN 5-500 MG PO TABS Oral Take 1  tablet by mouth at bedtime as needed. For severe pain.    Marland Kitchen HYOSCYAMINE SULFATE ER 0.375 MG PO TB12 Oral Take 0.375 mg by mouth once as needed. For stomach cramps    . VITAMIN D MAINTENANCE PO Oral Take 1 tablet by mouth at bedtime.      . OMEPRAZOLE 40 MG PO CPDR Oral Take 40 mg by mouth at bedtime.     Marland Kitchen SIMVASTATIN 20 MG PO TABS Oral Take 20 mg by mouth at bedtime.      Marland Kitchen ZOLPIDEM TARTRATE ER 12.5 MG PO TBCR Oral Take 12.5 mg by mouth at bedtime as needed. For sleep      BP 118/59  Pulse 73  Temp(Src) 98.1 F (36.7 C) (Oral)  Resp 16  Ht 5\' 3"  (1.6 m)  Wt 130 lb (58.968 kg)  BMI 23.03 kg/m2  SpO2 99%  Physical Exam  Constitutional: She is oriented to person, place, and time. She appears well-developed and well-nourished.  HENT:  Head: Normocephalic and atraumatic.       Erythematous oropharynx with palpable lymphadenopathy bilaterally. Uvula is midline and not edematous, no exudate.  Eyes: EOM are normal.  Cardiovascular: Normal rate and regular rhythm.   Pulmonary/Chest: Effort normal and breath sounds normal.  Abdominal: She exhibits no distension.  Musculoskeletal: She exhibits no edema and no tenderness.  Neurological: She is alert and oriented to person, place, and time.  Skin: Skin is warm and dry.    ED Course  Procedures (including critical care time)  Labs Reviewed - No data to display No results found.   No diagnosis found.    MDM  This [age] female presents with several days of mild cough and congestion. On initial exam she is in no distress has clear breath sounds bilaterally the mother does have a erythematous oropharynx with palpable lymphadenopathy. The patient's presentation is consistent with pharyngitis plus minus sinusitis. In the ED she received IM Decadron. There is no Flonase here, so she will be provided a prescription for this. She will also be instructed to followup with her primary care physician        Gerhard Munch, MD 09/08/11  2051

## 2011-12-27 ENCOUNTER — Other Ambulatory Visit: Payer: Self-pay | Admitting: Obstetrics & Gynecology

## 2011-12-27 DIAGNOSIS — Z1231 Encounter for screening mammogram for malignant neoplasm of breast: Secondary | ICD-10-CM

## 2011-12-29 ENCOUNTER — Ambulatory Visit
Admission: RE | Admit: 2011-12-29 | Discharge: 2011-12-29 | Disposition: A | Discharge status: Home / Self Care | Payer: Managed Care, Other (non HMO) | Source: Ambulatory Visit | Attending: Obstetrics & Gynecology | Admitting: Obstetrics & Gynecology

## 2011-12-29 ENCOUNTER — Ambulatory Visit: Payer: Managed Care, Other (non HMO)

## 2011-12-29 DIAGNOSIS — Z1231 Encounter for screening mammogram for malignant neoplasm of breast: Secondary | ICD-10-CM

## 2012-01-19 ENCOUNTER — Encounter (INDEPENDENT_AMBULATORY_CARE_PROVIDER_SITE_OTHER): Payer: Self-pay | Admitting: General Surgery

## 2012-01-23 ENCOUNTER — Encounter (INDEPENDENT_AMBULATORY_CARE_PROVIDER_SITE_OTHER): Payer: Self-pay | Admitting: General Surgery

## 2012-01-26 ENCOUNTER — Emergency Department (HOSPITAL_BASED_OUTPATIENT_CLINIC_OR_DEPARTMENT_OTHER)
Admission: EM | Admit: 2012-01-26 | Discharge: 2012-01-26 | Disposition: A | Discharge status: Home / Self Care | Payer: Managed Care, Other (non HMO) | Attending: Emergency Medicine | Admitting: Emergency Medicine

## 2012-01-26 ENCOUNTER — Encounter (HOSPITAL_BASED_OUTPATIENT_CLINIC_OR_DEPARTMENT_OTHER): Payer: Self-pay | Admitting: *Deleted

## 2012-01-26 ENCOUNTER — Emergency Department (INDEPENDENT_AMBULATORY_CARE_PROVIDER_SITE_OTHER): Payer: Managed Care, Other (non HMO)

## 2012-01-26 DIAGNOSIS — R10817 Generalized abdominal tenderness: Secondary | ICD-10-CM | POA: Insufficient documentation

## 2012-01-26 DIAGNOSIS — R197 Diarrhea, unspecified: Secondary | ICD-10-CM | POA: Insufficient documentation

## 2012-01-26 DIAGNOSIS — K219 Gastro-esophageal reflux disease without esophagitis: Secondary | ICD-10-CM | POA: Insufficient documentation

## 2012-01-26 DIAGNOSIS — R109 Unspecified abdominal pain: Secondary | ICD-10-CM | POA: Insufficient documentation

## 2012-01-26 DIAGNOSIS — K5730 Diverticulosis of large intestine without perforation or abscess without bleeding: Secondary | ICD-10-CM

## 2012-01-26 DIAGNOSIS — N949 Unspecified condition associated with female genital organs and menstrual cycle: Secondary | ICD-10-CM

## 2012-01-26 DIAGNOSIS — R110 Nausea: Secondary | ICD-10-CM | POA: Insufficient documentation

## 2012-01-26 DIAGNOSIS — F17200 Nicotine dependence, unspecified, uncomplicated: Secondary | ICD-10-CM | POA: Insufficient documentation

## 2012-01-26 DIAGNOSIS — Z9079 Acquired absence of other genital organ(s): Secondary | ICD-10-CM | POA: Insufficient documentation

## 2012-01-26 HISTORY — DX: Diverticulitis of intestine, part unspecified, without perforation or abscess without bleeding: K57.92

## 2012-01-26 LAB — COMPREHENSIVE METABOLIC PANEL
ALT: 17 U/L (ref 0–35)
AST: 32 U/L (ref 0–37)
CO2: 27 mEq/L (ref 19–32)
Calcium: 9.5 mg/dL (ref 8.4–10.5)
Chloride: 101 mEq/L (ref 96–112)
GFR calc Af Amer: >90 mL/min (ref >90)
GFR calc non Af Amer: >90 mL/min (ref >90)
Glucose, Bld: 115 mg/dL — ABNORMAL HIGH (ref 70–99)
Sodium: 139 mEq/L (ref 135–145)
Total Bilirubin: 0.4 mg/dL (ref 0.3–1.2)

## 2012-01-26 LAB — URINALYSIS, ROUTINE W REFLEX MICROSCOPIC
Bilirubin Urine: NEGATIVE
Glucose, UA: NEGATIVE mg/dL
Hgb urine dipstick: NEGATIVE
Ketones, ur: NEGATIVE mg/dL
Protein, ur: NEGATIVE mg/dL
Urobilinogen, UA: 0.2 mg/dL (ref 0.0–1.0)

## 2012-01-26 MED ORDER — IOHEXOL 300 MG/ML  SOLN
100.0000 mL | Freq: Once | INTRAMUSCULAR | Status: AC | PRN
  Administered 2012-01-26: 100 mL via INTRAVENOUS

## 2012-01-26 MED ORDER — MORPHINE SULFATE 4 MG/ML IJ SOLN
4.0000 mg | Freq: Once | INTRAMUSCULAR | Status: AC
  Administered 2012-01-26: 4 mg via INTRAVENOUS
  Filled 2012-01-26: qty 1

## 2012-01-26 MED ORDER — ONDANSETRON HCL 4 MG/2ML IJ SOLN
4.0000 mg | Freq: Once | INTRAMUSCULAR | Status: AC
  Administered 2012-01-26: 4 mg via INTRAVENOUS
  Filled 2012-01-26: qty 2

## 2012-01-26 MED ORDER — IOHEXOL 300 MG/ML  SOLN
20.0000 mL | INTRAMUSCULAR | Status: AC
  Administered 2012-01-26 (×2): 20 mL via ORAL

## 2012-01-26 NOTE — Discharge Instructions (Signed)

## 2012-01-26 NOTE — ED Provider Notes (Signed)
History     CSN: [id number]  Arrival date & time 01/26/12  1158   First MD Initiated Contact with Patient 01/26/12 1216      Chief Complaint  Patient presents with  . Abdominal Pain    (Consider location/radiation/quality/duration/timing/severity/associated sxs/prior treatment) HPI Comments: Pt states that she was diagnosed with diverticulitis by ct on 3/17:pt states that she is continuing to have lower abdominal pain and she has not had a bowel movement in several days although admits to a small bm this morning:pt has history of cholecystectomy and hysterectomy:denies vomiting and fever  Patient is a 56 y.o. female presenting with abdominal pain. The history is provided by the patient. No language interpreter was used.  Abdominal Pain The primary symptoms of the illness include abdominal pain, nausea and diarrhea. The primary symptoms of the illness do not include fever, dysuria or vaginal discharge. The current episode started more than 2 days ago. The problem has not changed since onset. Risk factors for an acute abdominal problem include a history of abdominal surgery. Symptoms associated with the illness do not include urgency or back pain.    Past Medical History  Diagnosis Date  . GERD (gastroesophageal reflux disease)   . Diverticulitis     Past Surgical History  Procedure Date  . Abdominal hysterectomy   . Joint replacement   . Cholecystectomy   . Tonsillectomy   . Bladder surgery     No family history on file.  History  Substance Use Topics  . Smoking status: Current Everyday Smoker -- 0.5 packs/day  . Smokeless tobacco: Not on file  . Alcohol Use: No    OB History    Grav Para Term Preterm Abortions TAB SAB Ect Mult Living                  Review of Systems  Constitutional: Negative for fever.  Eyes: Negative.   Respiratory: Negative.   Cardiovascular: Negative.   Gastrointestinal: Positive for nausea, abdominal pain and diarrhea.  Genitourinary:  Negative for dysuria, urgency and vaginal discharge.  Musculoskeletal: Negative for back pain.  Neurological: Negative.     Allergies  Penicillins; Sulfonamide derivatives; and Meperidine hcl  Home Medications   Current Outpatient Rx  Name Route Sig Dispense Refill  . CIPROFLOXACIN 250 MG/5ML (5%) PO SUSR Oral Take by mouth.    . METRONIDAZOLE 500 MG PO TABS Oral Take 500 mg by mouth 3 (three) times daily.    . ACETAMINOPHEN 500 MG PO TABS Oral Take 1,000 mg by mouth 3 (three) times daily as needed. For pain    . CITALOPRAM HYDROBROMIDE 40 MG PO TABS Oral Take 40 mg by mouth daily.      Marland Kitchen CLONAZEPAM 0.5 MG PO TABS Oral Take 0.5 mg by mouth 2 (two) times daily.     Marland Kitchen FLUTICASONE PROPIONATE 50 MCG/ACT NA SUSP Nasal Place 2 sprays into the nose 3 (three) times daily. Each nostril. - Do not use this medication for more than 48 hours.  Please discard the unused portion. 16 g 0  . HYDROCODONE-ACETAMINOPHEN 5-500 MG PO TABS Oral Take 1 tablet by mouth at bedtime as needed. For severe pain.    Marland Kitchen HYOSCYAMINE SULFATE ER 0.375 MG PO TB12 Oral Take 0.375 mg by mouth once as needed. For stomach cramps    . VITAMIN D MAINTENANCE PO Oral Take 1 tablet by mouth at bedtime.      . OMEPRAZOLE 40 MG PO CPDR Oral Take 40 mg by  mouth at bedtime.     Marland Kitchen SIMVASTATIN 20 MG PO TABS Oral Take 20 mg by mouth at bedtime.      Marland Kitchen ZOLPIDEM TARTRATE ER 12.5 MG PO TBCR Oral Take 12.5 mg by mouth at bedtime as needed. For sleep      BP 115/67  Pulse 78  Temp(Src) 98.2 F (36.8 C) (Oral)  Resp 16  Ht 5\' 3"  (1.6 m)  Wt 134 lb (60.782 kg)  BMI 23.74 kg/m2  SpO2 99%  Physical Exam  Nursing note and vitals reviewed. Constitutional: She is oriented to person, place, and time. She appears well-developed and well-nourished.  HENT:  Head: Atraumatic.  Eyes: EOM are normal.  Cardiovascular: Normal rate.   Pulmonary/Chest: Effort normal and breath sounds normal.  Abdominal: Soft. Bowel sounds are normal. There is  generalized tenderness.  Musculoskeletal: Normal range of motion.  Neurological: She is alert and oriented to person, place, and time.  Skin: Skin is warm and dry.    ED Course  Procedures (including critical care time)  Labs Reviewed  COMPREHENSIVE METABOLIC PANEL - Abnormal; Notable for the following:    Glucose, Bld 115 (*)    All other components within normal limits  LIPASE, BLOOD  URINALYSIS, ROUTINE W REFLEX MICROSCOPIC   Ct Abdomen Pelvis W Contrast  01/26/2012  *RADIOLOGY REPORT*  Clinical Data: 56 year old female with abdominal and pelvic pain.  CT ABDOMEN AND PELVIS WITH CONTRAST  Technique:  Multidetector CT imaging of the abdomen and pelvis was performed following the standard protocol during bolus administration of intravenous contrast.  Contrast:  100 ml intravenous Omnipaque-300  Comparison: 12/29/2007 CT  Findings:   The liver, spleen, adrenal glands, pancreas and kidneys are unremarkable except for bilateral renal cortical thinning. The patient is status post cholecystectomy and hysterectomy. No free fluid, enlarged lymph nodes, biliary dilation or abdominal aortic aneurysm identified.  The bowel is unremarkable except for a few scattered colonic diverticula. The bladder is within normal limits. There is no evidence of bowel obstruction, pneumoperitoneum or masses. Surgical clips in the right retroperitoneum are again identified.  No acute or suspicious bony abnormalities are identified.  IMPRESSION: No evidence of acute abnormality.  Colonic diverticulosis without evidence of diverticulitis.  Bilateral renal cortical atrophy.  Original Report Authenticated By: Rosendo Gros, M.D.     1. Abdominal pain       MDM  No acute findings:pt comfortable after pain medications here:pt is okay to follow up as needed:pt to continue the cipro/flagyl       Teressa Lower, NP 01/26/12 1533

## 2012-01-26 NOTE — ED Notes (Signed)
Patient states she has had left lower abdominal pain on 01/21/12.  Was seen at [facility] and given an CT scan which showed diverticulitis.  Was treated with antibiotics.  Followed up with her PCP on 01/22/12 and was found to have a bp of 80/60, pt was sent to [facility] ED for evaluation.  A second CT scan of abdomin was done and pt was diagnosed with colitis.  Patient states she is continues to had constipation, abdominal discomfort and bloating.  States she had taken miralax with no relief.  Last BM was a couple of weeks ago.

## 2012-01-27 NOTE — ED Provider Notes (Signed)
Medical screening examination/treatment/procedure(s) were performed by non-physician practitioner and as supervising physician I was immediately available for consultation/collaboration.   Lyanne Co, MD 01/27/12 2213

## 2012-02-06 ENCOUNTER — Encounter (INDEPENDENT_AMBULATORY_CARE_PROVIDER_SITE_OTHER): Payer: Self-pay | Admitting: General Surgery

## 2012-06-07 ENCOUNTER — Emergency Department (HOSPITAL_BASED_OUTPATIENT_CLINIC_OR_DEPARTMENT_OTHER)
Admission: EM | Admit: 2012-06-07 | Discharge: 2012-06-07 | Disposition: A | Discharge status: Home / Self Care | Payer: Managed Care, Other (non HMO) | Attending: Emergency Medicine | Admitting: Emergency Medicine

## 2012-06-07 ENCOUNTER — Encounter (HOSPITAL_BASED_OUTPATIENT_CLINIC_OR_DEPARTMENT_OTHER): Payer: Self-pay | Admitting: *Deleted

## 2012-06-07 DIAGNOSIS — Z79899 Other long term (current) drug therapy: Secondary | ICD-10-CM | POA: Insufficient documentation

## 2012-06-07 DIAGNOSIS — Z888 Allergy status to other drugs, medicaments and biological substances status: Secondary | ICD-10-CM | POA: Insufficient documentation

## 2012-06-07 DIAGNOSIS — Z880 Allergy status to penicillin: Secondary | ICD-10-CM | POA: Insufficient documentation

## 2012-06-07 DIAGNOSIS — Z882 Allergy status to sulfonamides status: Secondary | ICD-10-CM | POA: Insufficient documentation

## 2012-06-07 DIAGNOSIS — Z885 Allergy status to narcotic agent status: Secondary | ICD-10-CM | POA: Insufficient documentation

## 2012-06-07 DIAGNOSIS — J019 Acute sinusitis, unspecified: Secondary | ICD-10-CM

## 2012-06-07 DIAGNOSIS — F17200 Nicotine dependence, unspecified, uncomplicated: Secondary | ICD-10-CM | POA: Insufficient documentation

## 2012-06-07 DIAGNOSIS — K219 Gastro-esophageal reflux disease without esophagitis: Secondary | ICD-10-CM | POA: Insufficient documentation

## 2012-06-07 HISTORY — DX: Other allergy status, other than to drugs and biological substances: Z91.09

## 2012-06-07 MED ORDER — LORATADINE 10 MG PO TABS: 10.0000 mg | ORAL_TABLET | Freq: Every day | ORAL | Status: DC

## 2012-06-07 MED ORDER — ACETAMINOPHEN-CODEINE #3 300-30 MG PO TABS
1.0000 | ORAL_TABLET | Freq: Four times a day (QID) | ORAL | Status: AC | PRN
Start: 2012-06-07 — End: 2012-06-17

## 2012-06-07 MED ORDER — MOXIFLOXACIN HCL 400 MG PO TABS: 400.0000 mg | ORAL_TABLET | Freq: Every day | ORAL | Status: AC

## 2012-06-07 NOTE — ED Notes (Signed)
Stuffy head and nose, facial pain, scratchy throat x 2 weeks.

## 2012-06-07 NOTE — ED Provider Notes (Signed)
History     CSN: [id number]  Arrival date & time 06/07/12  2131   First MD Initiated Contact with Patient 06/07/12 2209      Chief Complaint  Patient presents with  . URI    (Consider location/radiation/quality/duration/timing/severity/associated sxs/prior treatment) HPI Comments: Patient reports that she has had nasal congestion associated with postnasal drip, mild sore throat and a dry cough ongoing for approximately 2 weeks. She reports that she cannot breathe through her nose very effectively. She does have a lot of facial pain and stuffiness. She has been taking her Flonase, over-the-counter cold medications and her doctor even put her on a Z-Pak approximately a week and a half ago with no resolution of her symptoms. She reports she has been having sweats and chills although she is unsure of actual fevers. She denies any significant chest pain, abdominal pain, nausea or vomiting. She is quite frustrated with the symptoms ongoing despite all her medications. At this point she is taking her Flonase as well as taking Mucinex. She does smoke cigarettes and reports that she is actively trying to quit. She denies any significant shortness of breath or chest tightness.  Patient is a 56 y.o. female presenting with URI. The history is provided by the patient.  URI The primary symptoms include headaches, sore throat, cough and myalgias. Primary symptoms do not include fever, abdominal pain, nausea or vomiting.  Symptoms associated with the illness include chills, sinus pressure and congestion.    Past Medical History  Diagnosis Date  . GERD (gastroesophageal reflux disease)   . Diverticulitis   . Environmental allergies     Past Surgical History  Procedure Date  . Abdominal hysterectomy   . Joint replacement   . Cholecystectomy   . Tonsillectomy   . Bladder surgery     No family history on file.  History  Substance Use Topics  . Smoking status: Current Everyday Smoker -- 0.5  packs/day  . Smokeless tobacco: Not on file  . Alcohol Use: No    OB History    Grav Para Term Preterm Abortions TAB SAB Ect Mult Living                  Review of Systems  Constitutional: Positive for chills. Negative for fever.  HENT: Positive for congestion, sore throat, postnasal drip and sinus pressure.   Respiratory: Positive for cough. Negative for shortness of breath.   Cardiovascular: Negative for chest pain.  Gastrointestinal: Negative for nausea, vomiting and abdominal pain.  Musculoskeletal: Positive for myalgias.  Neurological: Positive for headaches.    Allergies  Penicillins; Reglan; Sulfonamide derivatives; and Meperidine hcl  Home Medications   Current Outpatient Rx  Name Route Sig Dispense Refill  . ACETAMINOPHEN 500 MG PO TABS Oral Take 1,000 mg by mouth 3 (three) times daily as needed. For pain    . CITALOPRAM HYDROBROMIDE 40 MG PO TABS Oral Take 40 mg by mouth daily.      Marland Kitchen CLONAZEPAM 0.5 MG PO TABS Oral Take 0.5 mg by mouth 2 (two) times daily.     Marland Kitchen FLUTICASONE PROPIONATE 50 MCG/ACT NA SUSP Nasal Place 1 spray into the nose 3 (three) times daily. Each nostril. - Do not use this medication for more than 48 hours.  Please discard the unused portion.    Marland Kitchen OMEPRAZOLE 40 MG PO CPDR Oral Take 40 mg by mouth at bedtime.     Marland Kitchen SIMVASTATIN 20 MG PO TABS Oral Take 20 mg by mouth at  bedtime.      Marland Kitchen ZOLPIDEM TARTRATE ER 12.5 MG PO TBCR Oral Take 12.5 mg by mouth at bedtime as needed. For sleep    . ACETAMINOPHEN-CODEINE #3 300-30 MG PO TABS Oral Take 1-2 tablets by mouth every 6 (six) hours as needed for pain. 20 tablet 0  . LORATADINE 10 MG PO TABS Oral Take 1 tablet (10 mg total) by mouth daily. 14 tablet 0  . MOXIFLOXACIN HCL 400 MG PO TABS Oral Take 1 tablet (400 mg total) by mouth daily. 14 tablet 0    BP 118/68  Pulse 79  Temp 97.4 F (36.3 C) (Oral)  Resp 20  SpO2 100%  Physical Exam  Nursing note and vitals reviewed. Constitutional: She is oriented  to person, place, and time. She appears well-developed and well-nourished.  HENT:  Head: Normocephalic and atraumatic.  Right Ear: Hearing normal. No mastoid tenderness. Tympanic membrane is retracted. Tympanic membrane is not erythematous.  Left Ear: Hearing normal. No mastoid tenderness. Tympanic membrane is retracted. Tympanic membrane is not erythematous.  Nose: Mucosal edema present. No rhinorrhea. Right sinus exhibits maxillary sinus tenderness and frontal sinus tenderness. Left sinus exhibits maxillary sinus tenderness and frontal sinus tenderness.  Mouth/Throat: Uvula is midline, oropharynx is clear and moist and mucous membranes are normal.  Eyes: EOM are normal. Pupils are equal, round, and reactive to light.  Neck: Normal range of motion. Neck supple.  Cardiovascular: Normal rate.   Pulmonary/Chest: Effort normal. No respiratory distress. She has no wheezes.       Dry coughing present  Abdominal: Soft.  Lymphadenopathy:    She has no cervical adenopathy.  Neurological: She is alert and oriented to person, place, and time.  Skin: Skin is warm and dry. No rash noted.    ED Course  Procedures (including critical care time)  Labs Reviewed - No data to display No results found.   1. Sinusitis, acute      Room air saturation is 100% and normal. MDM   Patient's symptoms are consistent with sinusitis with associated postnasal drip and resultant dry coughing. Patient is RE tried a Z-Pak without improvement. I suspect the patient may be developing some chronic sinusitis requiring prolonged antibiotic course. She is already on Flonase and now also has some Claritin as a antihistamine.        Gavin Pound. Oletta Lamas, MD 06/07/12 2236

## 2012-06-07 NOTE — Discharge Instructions (Signed)
 Sinusitis Sinuses are air pockets within the bones of your face. The growth of bacteria within a sinus leads to infection. The infection prevents the sinuses from draining. This infection is called sinusitis. SYMPTOMS  There will be different areas of pain depending on which sinuses have become infected.  The maxillary sinuses often produce pain beneath the eyes.   Frontal sinusitis may cause pain in the middle of the forehead and above the eyes.  Other problems (symptoms) include:  Toothaches.   Colored, pus-like (purulent) drainage from the nose.   Swelling, warmth, and tenderness over the sinus areas may be signs of infection.  TREATMENT  Sinusitis is most often determined by an exam.X-rays may be taken. If x-rays have been taken, make sure you obtain your results or find out how you are to obtain them. Your caregiver may give you medications (antibiotics). These are medications that will help kill the bacteria causing the infection. You may also be given a medication (decongestant) that helps to reduce sinus swelling.  HOME CARE INSTRUCTIONS   Only take over-the-counter or prescription medicines for pain, discomfort, or fever as directed by your caregiver.   Drink extra fluids. Fluids help thin the mucus so your sinuses can drain more easily.   Applying either moist heat or ice packs to the sinus areas may help relieve discomfort.   Use saline nasal sprays to help moisten your sinuses. The sprays can be found at your local drugstore.  SEEK IMMEDIATE MEDICAL CARE IF:  You have a fever.   You have increasing pain, severe headaches, or toothache.   You have nausea, vomiting, or drowsiness.   You develop unusual swelling around the face or trouble seeing.  MAKE SURE YOU:   Understand these instructions.   Will watch your condition.   Will get help right away if you are not doing well or get worse.  Document Released: 10/23/2005 Document Revised: 10/12/2011 Document Reviewed:  05/22/2007 [facility] Patient Information 2012 [place], [place].

## 2012-06-07 NOTE — ED Notes (Signed)
Pt reports she thinks she has, "a sinus infection." for 2 weeks. States she cannot take it anymore and this is why she came to the ED today. Had Z-pack 2 weeks ago and has been taking OTC meds with no relief.

## 2013-03-02 ENCOUNTER — Emergency Department (HOSPITAL_BASED_OUTPATIENT_CLINIC_OR_DEPARTMENT_OTHER): Payer: Managed Care, Other (non HMO)

## 2013-03-02 ENCOUNTER — Encounter (HOSPITAL_BASED_OUTPATIENT_CLINIC_OR_DEPARTMENT_OTHER): Payer: Self-pay

## 2013-03-02 ENCOUNTER — Emergency Department (HOSPITAL_BASED_OUTPATIENT_CLINIC_OR_DEPARTMENT_OTHER)
Admission: EM | Admit: 2013-03-02 | Discharge: 2013-03-02 | Disposition: A | Discharge status: Home / Self Care | Payer: Managed Care, Other (non HMO) | Attending: Emergency Medicine | Admitting: Emergency Medicine

## 2013-03-02 DIAGNOSIS — Z880 Allergy status to penicillin: Secondary | ICD-10-CM | POA: Insufficient documentation

## 2013-03-02 DIAGNOSIS — Z8709 Personal history of other diseases of the respiratory system: Secondary | ICD-10-CM | POA: Insufficient documentation

## 2013-03-02 DIAGNOSIS — R071 Chest pain on breathing: Secondary | ICD-10-CM | POA: Insufficient documentation

## 2013-03-02 DIAGNOSIS — K219 Gastro-esophageal reflux disease without esophagitis: Secondary | ICD-10-CM | POA: Insufficient documentation

## 2013-03-02 DIAGNOSIS — R110 Nausea: Secondary | ICD-10-CM | POA: Insufficient documentation

## 2013-03-02 DIAGNOSIS — R0789 Other chest pain: Secondary | ICD-10-CM

## 2013-03-02 DIAGNOSIS — Z8719 Personal history of other diseases of the digestive system: Secondary | ICD-10-CM | POA: Insufficient documentation

## 2013-03-02 DIAGNOSIS — Z79899 Other long term (current) drug therapy: Secondary | ICD-10-CM | POA: Insufficient documentation

## 2013-03-02 DIAGNOSIS — F17200 Nicotine dependence, unspecified, uncomplicated: Secondary | ICD-10-CM | POA: Insufficient documentation

## 2013-03-02 DIAGNOSIS — IMO0002 Reserved for concepts with insufficient information to code with codable children: Secondary | ICD-10-CM | POA: Insufficient documentation

## 2013-03-02 LAB — CBC WITH DIFFERENTIAL/PLATELET
Basophils Absolute: 0.1 10*3/uL (ref 0.0–0.1)
Basophils Relative: 1 % (ref 0–1)
Eosinophils Relative: 2 % (ref 0–5)
HCT: 39.4 % (ref 36.0–46.0)
Hemoglobin: 13.5 g/dL (ref 12.0–15.0)
MCHC: 34.3 g/dL (ref 30.0–36.0)
MCV: 93.6 fL (ref 78.0–100.0)
Monocytes Absolute: 0.6 10*3/uL (ref 0.1–1.0)
Monocytes Relative: 11 % (ref 3–12)
Neutro Abs: 3.6 10*3/uL (ref 1.7–7.7)
RDW: 13.8 % (ref 11.5–15.5)

## 2013-03-02 LAB — URINALYSIS, ROUTINE W REFLEX MICROSCOPIC
Bilirubin Urine: NEGATIVE
Glucose, UA: NEGATIVE mg/dL
Hgb urine dipstick: NEGATIVE
Ketones, ur: NEGATIVE mg/dL
Leukocytes, UA: NEGATIVE
Protein, ur: NEGATIVE mg/dL
pH: 7 (ref 5.0–8.0)

## 2013-03-02 LAB — COMPREHENSIVE METABOLIC PANEL
Albumin: 3.9 g/dL (ref 3.5–5.2)
BUN: 14 mg/dL (ref 6–23)
CO2: 26 mEq/L (ref 19–32)
Calcium: 9.5 mg/dL (ref 8.4–10.5)
Chloride: 103 mEq/L (ref 96–112)
Creatinine, Ser: 0.8 mg/dL (ref 0.50–1.10)
GFR calc non Af Amer: 80 mL/min — ABNORMAL LOW (ref >90)
Total Bilirubin: 0.3 mg/dL (ref 0.3–1.2)

## 2013-03-02 MED ORDER — HYDROCODONE-ACETAMINOPHEN 5-325 MG PO TABS: 2.0000 | ORAL_TABLET | Freq: Four times a day (QID) | ORAL | Status: DC | PRN

## 2013-03-02 NOTE — ED Notes (Signed)
Pt states that she had onset of RUQ abdominal x1 week.  Pt states that she went to see PCP, was told that it may be inflammation, was given prednisone.  No improvement.  Pt states that today pain has become unbearable.

## 2013-03-02 NOTE — ED Provider Notes (Signed)
History     CSN: [id number]  Arrival date & time 03/02/13  1212   First MD Initiated Contact with Patient 03/02/13 1303      Chief Complaint  Patient presents with  . Abdominal Pain    (Consider location/radiation/quality/duration/timing/severity/associated sxs/prior treatment) Patient is a 57 y.o. female presenting with abdominal pain.  Abdominal Pain  Pt reports about a week of sharp, severe pain in R lower ribs, worse with movement and deep breath. denies any injury. Pain was gradual in onset. No SOB or fever. She has had some nausea but no vomiting. Seen by PCP earlier in the week who though she had some chest wall inflammation and started her on Prednisone with no improvement and so she came to the ED for evaluation.   Past Medical History  Diagnosis Date  . GERD (gastroesophageal reflux disease)   . Diverticulitis   . Environmental allergies     Past Surgical History  Procedure Laterality Date  . Abdominal hysterectomy    . Joint replacement    . Cholecystectomy    . Tonsillectomy    . Bladder surgery      History reviewed. No pertinent family history.  History  Substance Use Topics  . Smoking status: Current Every Day Smoker -- 0.50 packs/day  . Smokeless tobacco: Not on file  . Alcohol Use: No    OB History   Grav Para Term Preterm Abortions TAB SAB Ect Mult Living                  Review of Systems  Gastrointestinal: Positive for abdominal pain.   All other systems reviewed and are negative except as noted in HPI.   Allergies  Penicillins; Reglan; Sulfonamide derivatives; and Meperidine hcl  Home Medications   Current Outpatient Rx  Name  Route  Sig  Dispense  Refill  . acetaminophen (TYLENOL) 500 MG tablet   Oral   Take 1,000 mg by mouth 3 (three) times daily as needed. For pain         . citalopram (CELEXA) 40 MG tablet   Oral   Take 40 mg by mouth daily.           . clonazePAM (KLONOPIN) 0.5 MG tablet   Oral   Take 0.5 mg by  mouth 2 (two) times daily.          . fluticasone (FLONASE) 50 MCG/ACT nasal spray   Nasal   Place 1 spray into the nose 3 (three) times daily. Each nostril. - Do not use this medication for more than 48 hours.  Please discard the unused portion.         Marland Kitchen loratadine (CLARITIN) 10 MG tablet   Oral   Take 1 tablet (10 mg total) by mouth daily.   14 tablet   0   . omeprazole (PRILOSEC) 40 MG capsule   Oral   Take 40 mg by mouth at bedtime.          Marland Kitchen zolpidem (AMBIEN CR) 12.5 MG CR tablet   Oral   Take 12.5 mg by mouth at bedtime as needed. For sleep         . simvastatin (ZOCOR) 20 MG tablet   Oral   Take 20 mg by mouth at bedtime.             BP 141/95  Pulse 103  Temp(Src) 97.8 F (36.6 C) (Oral)  Resp 20  Ht 5\' 3"  (1.6 m)  Wt 137 lb (  62.143 kg)  BMI 24.27 kg/m2  SpO2 94%  Physical Exam  Nursing note and vitals reviewed. Constitutional: She is oriented to person, place, and time. She appears well-developed and well-nourished.  HENT:  Head: Normocephalic and atraumatic.  Eyes: EOM are normal. Pupils are equal, round, and reactive to light.  Neck: Normal range of motion. Neck supple.  Cardiovascular: Normal rate, normal heart sounds and intact distal pulses.   Pulmonary/Chest: Effort normal and breath sounds normal. She exhibits tenderness (tender over the R lateral 10-12th ribs).  Abdominal: Bowel sounds are normal. She exhibits no distension. There is no tenderness. There is no rebound and no guarding.  Musculoskeletal: Normal range of motion. She exhibits no edema and no tenderness.  Neurological: She is alert and oriented to person, place, and time. She has normal strength. No cranial nerve deficit or sensory deficit.  Skin: Skin is warm and dry. No rash noted.  Psychiatric: She has a normal mood and affect.    ED Course  Procedures (including critical care time)  Labs Reviewed  COMPREHENSIVE METABOLIC PANEL - Abnormal; Notable for the following:     Glucose, Bld 101 (*)    GFR calc non Af Amer 80 (*)    All other components within normal limits  URINALYSIS, ROUTINE W REFLEX MICROSCOPIC  CBC WITH DIFFERENTIAL   Dg Ribs Unilateral W/chest Right  03/02/2013  *RADIOLOGY REPORT*  Clinical Data: Right rib pain  RIGHT RIBS AND CHEST - 3+ VIEW  Comparison: 10/28/2008  Findings: The lungs are clear.  Negative for pneumonia.  Negative for heart failure or mass lesion.  The heart size is normal.  Negative for right rib fracture or bony lesion.  IMPRESSION: No acute abnormality.   Original Report Authenticated By: Janeece Riggers, M.D.      1. Chest wall pain       MDM  R lower chest wall pain, neg for pulmonary or hepatic process, no rib fracture. Pain meds as needed and close PCP followup.        Charles B. Bernette Mayers, MD 03/02/13 1452

## 2014-01-17 ENCOUNTER — Encounter (HOSPITAL_BASED_OUTPATIENT_CLINIC_OR_DEPARTMENT_OTHER): Payer: Self-pay | Admitting: Emergency Medicine

## 2014-01-17 ENCOUNTER — Emergency Department (HOSPITAL_BASED_OUTPATIENT_CLINIC_OR_DEPARTMENT_OTHER)
Admission: EM | Admit: 2014-01-17 | Discharge: 2014-01-17 | Disposition: A | Discharge status: Home / Self Care | Payer: BC Managed Care – PPO | Attending: Emergency Medicine | Admitting: Emergency Medicine

## 2014-01-17 DIAGNOSIS — Z87891 Personal history of nicotine dependence: Secondary | ICD-10-CM | POA: Insufficient documentation

## 2014-01-17 DIAGNOSIS — M5430 Sciatica, unspecified side: Secondary | ICD-10-CM | POA: Insufficient documentation

## 2014-01-17 DIAGNOSIS — M79609 Pain in unspecified limb: Secondary | ICD-10-CM | POA: Insufficient documentation

## 2014-01-17 DIAGNOSIS — Z880 Allergy status to penicillin: Secondary | ICD-10-CM | POA: Insufficient documentation

## 2014-01-17 DIAGNOSIS — K219 Gastro-esophageal reflux disease without esophagitis: Secondary | ICD-10-CM | POA: Insufficient documentation

## 2014-01-17 DIAGNOSIS — Z79899 Other long term (current) drug therapy: Secondary | ICD-10-CM | POA: Insufficient documentation

## 2014-01-17 DIAGNOSIS — IMO0002 Reserved for concepts with insufficient information to code with codable children: Secondary | ICD-10-CM | POA: Insufficient documentation

## 2014-01-17 MED ORDER — DIAZEPAM 5 MG PO TABS: 5.0000 mg | ORAL_TABLET | Freq: Three times a day (TID) | ORAL | Status: DC | PRN

## 2014-01-17 MED ORDER — HYDROMORPHONE HCL PF 1 MG/ML IJ SOLN
1.0000 mg | Freq: Once | INTRAMUSCULAR | Status: AC
  Administered 2014-01-17: 1 mg via INTRAMUSCULAR
  Filled 2014-01-17: qty 1

## 2014-01-17 MED ORDER — PREDNISONE 10 MG PO TABS: 20.0000 mg | ORAL_TABLET | Freq: Every day | ORAL | Status: DC

## 2014-01-17 MED ORDER — DIAZEPAM 5 MG/ML IJ SOLN
5.0000 mg | Freq: Once | INTRAMUSCULAR | Status: AC
  Administered 2014-01-17: 5 mg via INTRAMUSCULAR
  Filled 2014-01-17: qty 2

## 2014-01-17 MED ORDER — HYDROCODONE-ACETAMINOPHEN 5-325 MG PO TABS: 2.0000 | ORAL_TABLET | Freq: Four times a day (QID) | ORAL | Status: DC | PRN

## 2014-01-17 NOTE — Discharge Instructions (Signed)
Sciatica °Sciatica is pain, weakness, numbness, or tingling along the path of the sciatic nerve. The nerve starts in the lower back and runs down the back of each leg. The nerve controls the muscles in the lower leg and in the back of the knee, while also providing sensation to the back of the thigh, lower leg, and the sole of your foot. Sciatica is a symptom of another medical condition. For instance, nerve damage or certain conditions, such as a herniated disk or bone spur on the spine, pinch or put pressure on the sciatic nerve. This causes the pain, weakness, or other sensations normally associated with sciatica. Generally, sciatica only affects one side of the body. °CAUSES  °· Herniated or slipped disc. °· Degenerative disk disease. °· A pain disorder involving the narrow muscle in the buttocks (piriformis syndrome). °· Pelvic injury or fracture. °· Pregnancy. °· Tumor (rare). °SYMPTOMS  °Symptoms can vary from mild to very severe. The symptoms usually travel from the low back to the buttocks and down the back of the leg. Symptoms can include: °· Mild tingling or dull aches in the lower back, leg, or hip. °· Numbness in the back of the calf or sole of the foot. °· Burning sensations in the lower back, leg, or hip. °· Sharp pains in the lower back, leg, or hip. °· Leg weakness. °· Severe back pain inhibiting movement. °These symptoms may get worse with coughing, sneezing, laughing, or prolonged sitting or standing. Also, being overweight may worsen symptoms. °DIAGNOSIS  °Your caregiver will perform a physical exam to look for common symptoms of sciatica. He or she may ask you to do certain movements or activities that would trigger sciatic nerve pain. Other tests may be performed to find the cause of the sciatica. These may include: °· Blood tests. °· X-rays. °· Imaging tests, such as an MRI or CT scan. °TREATMENT  °Treatment is directed at the cause of the sciatic pain. Sometimes, treatment is not necessary  and the pain and discomfort goes away on its own. If treatment is needed, your caregiver may suggest: °· Over-the-counter medicines to relieve pain. °· Prescription medicines, such as anti-inflammatory medicine, muscle relaxants, or narcotics. °· Applying heat or ice to the painful area. °· Steroid injections to lessen pain, irritation, and inflammation around the nerve. °· Reducing activity during periods of pain. °· Exercising and stretching to strengthen your abdomen and improve flexibility of your spine. Your caregiver may suggest losing weight if the extra weight makes the back pain worse. °· Physical therapy. °· Surgery to eliminate what is pressing or pinching the nerve, such as a bone spur or part of a herniated disk. °HOME CARE INSTRUCTIONS  °· Only take over-the-counter or prescription medicines for pain or discomfort as directed by your caregiver. °· Apply ice to the affected area for 20 minutes, 3 4 times a day for the first 48 72 hours. Then try heat in the same way. °· Exercise, stretch, or perform your usual activities if these do not aggravate your pain. °· Attend physical therapy sessions as directed by your caregiver. °· Keep all follow-up appointments as directed by your caregiver. °· Do not wear high heels or shoes that do not provide proper support. °· Check your mattress to see if it is too soft. A firm mattress may lessen your pain and discomfort. °SEEK IMMEDIATE MEDICAL CARE IF:  °· You lose control of your bowel or bladder (incontinence). °· You have increasing weakness in the lower back,   pelvis, buttocks, or legs. °· You have redness or swelling of your back. °· You have a burning sensation when you urinate. °· You have pain that gets worse when you lie down or awakens you at night. °· Your pain is worse than you have experienced in the past. °· Your pain is lasting longer than 4 weeks. °· You are suddenly losing weight without reason. °MAKE SURE YOU: °· Understand these  instructions. °· Will watch your condition. °· Will get help right away if you are not doing well or get worse. °Document Released: 10/17/2001 Document Revised: 04/23/2012 Document Reviewed: 03/03/2012 °[employer]® Patient Information ©2014 [employer]. ° °

## 2014-01-17 NOTE — ED Provider Notes (Signed)
CSN: [id number]     Arrival date & time 01/17/14  1902 History   First MD Initiated Contact with Patient 01/17/14 2203     Chief Complaint  Patient presents with  . Back Pain     (Consider location/radiation/quality/duration/timing/severity/associated sxs/prior Treatment) HPI Comments: Patient is 58 year old female who presents to the ED after lifting air filters while at [employer] today - she reports immediate sharp stabbing pain to lower back and into left buttocks, she reports remote history of sciatica which she states this is similar to - she has tried tylenol at home without relief of the pain - states pain worse with movement, denies numbness, tingling, weakness, nausea, vomiting, loss of control of bowels or bladder.  Patient is a 58 y.o. female presenting with back pain. The history is provided by the patient. No language interpreter was used.  Back Pain Location:  Lumbar spine Quality:  Stabbing, stiffness and cramping Stiffness is present:  All day Radiates to:  L thigh Pain severity:  Severe Pain is:  Same all the time Onset quality:  Sudden Duration:  12 hours Timing:  Constant Progression:  Worsening Chronicity:  New Context: lifting heavy objects   Relieved by:  Nothing Worsened by:  Movement Ineffective treatments:  None tried Associated symptoms: leg pain   Associated symptoms: no abdominal pain, no bladder incontinence, no bowel incontinence, no dysuria, no fever, no numbness, no paresthesias, no pelvic pain, no tingling and no weakness     Past Medical History  Diagnosis Date  . GERD (gastroesophageal reflux disease)   . Diverticulitis   . Environmental allergies    Past Surgical History  Procedure Laterality Date  . Abdominal hysterectomy    . Joint replacement    . Cholecystectomy    . Tonsillectomy    . Bladder surgery     No family history on file. History  Substance Use Topics  . Smoking status: Former Smoker -- 0.50 packs/day  . Smokeless  tobacco: Not on file  . Alcohol Use: No   OB History   Grav Para Term Preterm Abortions TAB SAB Ect Mult Living                 Review of Systems  Constitutional: Negative for fever.  Gastrointestinal: Negative for abdominal pain and bowel incontinence.  Genitourinary: Negative for bladder incontinence, dysuria and pelvic pain.  Musculoskeletal: Positive for back pain.  Neurological: Negative for tingling, weakness, numbness and paresthesias.  All other systems reviewed and are negative.      Allergies  Penicillins; Reglan; Sulfonamide derivatives; and Meperidine hcl  Home Medications   Current Outpatient Rx  Name  Route  Sig  Dispense  Refill  . acetaminophen (TYLENOL) 500 MG tablet   Oral   Take 1,000 mg by mouth 3 (three) times daily as needed. For pain         . citalopram (CELEXA) 40 MG tablet   Oral   Take 40 mg by mouth daily.           . clonazePAM (KLONOPIN) 0.5 MG tablet   Oral   Take 0.5 mg by mouth 2 (two) times daily.          . fluticasone (FLONASE) 50 MCG/ACT nasal spray   Nasal   Place 1 spray into the nose 3 (three) times daily. Each nostril. - Do not use this medication for more than 48 hours.  Please discard the unused portion.         Marland Kitchen  HYDROcodone-acetaminophen (NORCO/VICODIN) 5-325 MG per tablet   Oral   Take 2 tablets by mouth every 6 (six) hours as needed for pain.   30 tablet   0   . EXPIRED: loratadine (CLARITIN) 10 MG tablet   Oral   Take 1 tablet (10 mg total) by mouth daily.   14 tablet   0   . omeprazole (PRILOSEC) 40 MG capsule   Oral   Take 40 mg by mouth at bedtime.          . simvastatin (ZOCOR) 20 MG tablet   Oral   Take 20 mg by mouth at bedtime.           Marland Kitchen zolpidem (AMBIEN CR) 12.5 MG CR tablet   Oral   Take 12.5 mg by mouth at bedtime as needed. For sleep          BP 120/49  Pulse 98  Temp(Src) 99.1 F (37.3 C) (Oral)  Resp 16  Ht 5\' 3"  (1.6 m)  Wt 167 lb (75.751 kg)  BMI 29.59 kg/m2  SpO2  99% Physical Exam  Nursing note and vitals reviewed. Constitutional: She is oriented to person, place, and time. She appears well-developed and well-nourished. She appears distressed.  tearful  HENT:  Head: Normocephalic and atraumatic.  Mouth/Throat: Oropharynx is clear and moist.  Eyes: Conjunctivae are normal. No scleral icterus.  Neck: Normal range of motion. Neck supple. No spinous process tenderness and no muscular tenderness present.  Pulmonary/Chest: Effort normal.  Musculoskeletal:       Thoracic back: She exhibits normal range of motion, no tenderness and no bony tenderness.       Lumbar back: She exhibits tenderness, bony tenderness and spasm. She exhibits normal range of motion.       Back:  Lymphadenopathy:    She has no cervical adenopathy.  Neurological: She is alert and oriented to person, place, and time. She displays no atrophy. No cranial nerve deficit or sensory deficit. She exhibits normal muscle tone. She displays a negative Romberg sign. Coordination and gait normal. GCS eye subscore is 4. GCS verbal subscore is 5. GCS motor subscore is 6. She displays no Babinski's sign on the right side. She displays no Babinski's sign on the left side.  Reflex Scores:      Patellar reflexes are 2+ on the right side and 2+ on the left side.      Achilles reflexes are 2+ on the right side and 2+ on the left side. 5/5 strength in bilateral hip flexors, quads, hamstrings  Skin: Skin is warm and dry. No rash noted. No erythema. No pallor.  Psychiatric: She has a normal mood and affect. Her behavior is normal. Judgment and thought content normal.    ED Course  Procedures (including critical care time) Labs Review Labs Reviewed - No data to display Imaging Review No results found.   EKG Interpretation None      MDM   Sciatica   Patient presents with sudden onset of sciatic back pain - reports similar in past - no alarming signs to suggest cauda equina or epidural  hematoma.  Plan short course pain medication and muscle relaxers as well as steroid dose pack.    Kristin Needle Joelyn Oms, PA-C 01/17/14 2219

## 2014-01-17 NOTE — ED Notes (Signed)
Bent over at the store today to pick something up, c/o onset of low back pain.  Pain radiates into right leg.  Is able to ambulate, denies prior history of this happening.

## 2014-01-18 NOTE — ED Provider Notes (Signed)
Medical screening examination/treatment/procedure(s) were performed by non-physician practitioner and as supervising physician I was immediately available for consultation/collaboration.   EKG Interpretation None        Ephraim Hamburger, MD 01/18/14 1200

## 2016-01-04 DIAGNOSIS — R1013 Epigastric pain: Secondary | ICD-10-CM

## 2016-01-04 DIAGNOSIS — Z1211 Encounter for screening for malignant neoplasm of colon: Secondary | ICD-10-CM

## 2016-01-04 DIAGNOSIS — K581 Irritable bowel syndrome with constipation: Secondary | ICD-10-CM

## 2016-01-04 HISTORY — DX: Epigastric pain: R10.13

## 2016-01-04 HISTORY — DX: Irritable bowel syndrome with constipation: K58.1

## 2016-01-04 HISTORY — DX: Encounter for screening for malignant neoplasm of colon: Z12.11

## 2016-01-24 ENCOUNTER — Encounter: Payer: Self-pay | Admitting: Internal Medicine

## 2016-03-07 DIAGNOSIS — G4700 Insomnia, unspecified: Secondary | ICD-10-CM

## 2016-03-07 DIAGNOSIS — G8929 Other chronic pain: Secondary | ICD-10-CM | POA: Insufficient documentation

## 2016-03-07 DIAGNOSIS — E785 Hyperlipidemia, unspecified: Secondary | ICD-10-CM | POA: Insufficient documentation

## 2016-03-07 HISTORY — DX: Insomnia, unspecified: G47.00

## 2016-03-07 HISTORY — DX: Other chronic pain: G89.29

## 2016-03-07 HISTORY — DX: Hyperlipidemia, unspecified: E78.5

## 2018-02-20 ENCOUNTER — Ambulatory Visit (HOSPITAL_BASED_OUTPATIENT_CLINIC_OR_DEPARTMENT_OTHER): Admission: RE | Admit: 2018-02-20 | Payer: 59 | Source: Ambulatory Visit

## 2018-02-20 ENCOUNTER — Other Ambulatory Visit (HOSPITAL_BASED_OUTPATIENT_CLINIC_OR_DEPARTMENT_OTHER): Payer: Self-pay | Admitting: Physician Assistant

## 2018-02-20 DIAGNOSIS — M7918 Myalgia, other site: Secondary | ICD-10-CM

## 2018-02-20 DIAGNOSIS — M7989 Other specified soft tissue disorders: Secondary | ICD-10-CM

## 2018-06-06 ENCOUNTER — Emergency Department (HOSPITAL_COMMUNITY)
Admission: EM | Admit: 2018-06-06 | Discharge: 2018-06-06 | Disposition: A | Discharge status: Home / Self Care | Payer: 59 | Attending: Emergency Medicine | Admitting: Emergency Medicine

## 2018-06-06 ENCOUNTER — Encounter (HOSPITAL_COMMUNITY): Payer: Self-pay

## 2018-06-06 ENCOUNTER — Other Ambulatory Visit: Payer: Self-pay

## 2018-06-06 ENCOUNTER — Emergency Department (HOSPITAL_COMMUNITY): Payer: 59

## 2018-06-06 DIAGNOSIS — Z79899 Other long term (current) drug therapy: Secondary | ICD-10-CM | POA: Diagnosis not present

## 2018-06-06 DIAGNOSIS — Y92003 Bedroom of unspecified non-institutional (private) residence as the place of occurrence of the external cause: Secondary | ICD-10-CM | POA: Diagnosis not present

## 2018-06-06 DIAGNOSIS — Y9389 Activity, other specified: Secondary | ICD-10-CM | POA: Insufficient documentation

## 2018-06-06 DIAGNOSIS — F17200 Nicotine dependence, unspecified, uncomplicated: Secondary | ICD-10-CM | POA: Diagnosis not present

## 2018-06-06 DIAGNOSIS — W06XXXA Fall from bed, initial encounter: Secondary | ICD-10-CM | POA: Insufficient documentation

## 2018-06-06 DIAGNOSIS — S99912A Unspecified injury of left ankle, initial encounter: Secondary | ICD-10-CM | POA: Diagnosis present

## 2018-06-06 DIAGNOSIS — S82832A Other fracture of upper and lower end of left fibula, initial encounter for closed fracture: Secondary | ICD-10-CM | POA: Diagnosis not present

## 2018-06-06 DIAGNOSIS — Y998 Other external cause status: Secondary | ICD-10-CM | POA: Insufficient documentation

## 2018-06-06 MED ORDER — OXYCODONE-ACETAMINOPHEN 5-325 MG PO TABS
1.0000 | ORAL_TABLET | Freq: Once | ORAL | Status: AC
Start: 2018-06-06 — End: 2018-06-06
  Administered 2018-06-06: 1 via ORAL
  Filled 2018-06-06: qty 1

## 2018-06-06 MED ORDER — OXYCODONE-ACETAMINOPHEN 5-325 MG PO TABS: 1.0000 | ORAL_TABLET | Freq: Four times a day (QID) | ORAL | 0 refills | Status: DC | PRN

## 2018-06-06 NOTE — ED Notes (Signed)
Ortho tech called 

## 2018-06-06 NOTE — ED Notes (Signed)
Ortho tech at bedside 

## 2018-06-06 NOTE — Discharge Instructions (Signed)
You have a broken ankle.   Please call and schedule an appointment with the orthopedic doctor (Dr. Berenice Primas) with [facility] orthopedics for follow-up appointment and further management.  You can take Percocet as needed for pain.  This medicine can make you drowsy so please do not drive or work while taking it.  Return to the ER if you have any new concerns like numbness which she cannot feel your toes or foot.

## 2018-06-06 NOTE — ED Provider Notes (Signed)
[facility] DEPT Provider Note   CSN: [id number] Arrival date & time: 06/06/18  0718     History   Chief Complaint Chief Complaint  Patient presents with  . Ankle Pain    HPI [name] is a 62 y.o. female.  HPI  [name] is a 62yo female with no significant past medical history who presents to the Emergency Department for evaluation of left ankle pain. Patient states that she was getting out of bed today and her right knee gave out and she stepped wrong on her left ankle.  She reports that she has had persistent, severe left ankle pain over the lateral malleolus ever since.  Is worse with movement or trying to weight-bear.  She has not taken any over-the-counter medications for her symptoms.  She denies knee pain. She denies fever, chills, break in skin, numbness, weakness.  Past Medical History:  Diagnosis Date  . Diverticulitis   . Environmental allergies   . GERD (gastroesophageal reflux disease)     Patient Active Problem List   Diagnosis Date Noted  . KNEE PAIN, RIGHT 10/10/2010  . CHANGE IN BOWELS 01/04/2010  . ABDOMINAL PAIN, UPPER 01/04/2010    Past Surgical History:  Procedure Laterality Date  . ABDOMINAL HYSTERECTOMY    . BLADDER SURGERY    . CHOLECYSTECTOMY    . JOINT REPLACEMENT    . TONSILLECTOMY       OB History   None      Home Medications    Prior to Admission medications   Medication Sig Start Date End Date Taking? Authorizing Provider  acetaminophen (TYLENOL) 500 MG tablet Take 1,000 mg by mouth 3 (three) times daily as needed. For pain    [provider]  citalopram (CELEXA) 40 MG tablet Take 40 mg by mouth daily.      [provider]  clonazePAM (KLONOPIN) 0.5 MG tablet Take 0.5 mg by mouth 2 (two) times daily.     [provider]  diazepam (VALIUM) 5 MG tablet Take 1 tablet (5 mg total) by mouth every 8 (eight) hours as needed for muscle spasms. 01/17/14   Ignacia Felling, PA-C   fluticasone (FLONASE) 50 MCG/ACT nasal spray Place 1 spray into the nose 3 (three) times daily. Each nostril. - Do not use this medication for more than 48 hours.  Please discard the unused portion. 09/08/11   Carmin Muskrat, MD  HYDROcodone-acetaminophen (NORCO/VICODIN) 5-325 MG per tablet Take 2 tablets by mouth every 6 (six) hours as needed. 01/17/14   Ignacia Felling, PA-C  loratadine (CLARITIN) 10 MG tablet Take 1 tablet (10 mg total) by mouth daily. 06/07/12 06/07/13  Kingsley Spittle, MD  omeprazole (PRILOSEC) 40 MG capsule Take 40 mg by mouth at bedtime.     [provider]  predniSONE (DELTASONE) 10 MG tablet Take 2 tablets (20 mg total) by mouth daily. 01/17/14   Ignacia Felling, PA-C  simvastatin (ZOCOR) 20 MG tablet Take 20 mg by mouth at bedtime.      [provider]  zolpidem (AMBIEN CR) 12.5 MG CR tablet Take 12.5 mg by mouth at bedtime as needed. For sleep    [provider]    Family History No family history on file.  Social History Social History   Tobacco Use  . Smoking status: Current Every Day Smoker    Packs/day: 0.50  . Smokeless tobacco: Never Used  Substance Use Topics  . Alcohol use: No  . Drug use: No  Allergies   Penicillins; Reglan [metoclopramide]; Sulfonamide derivatives; and Meperidine hcl   Review of Systems Review of Systems  Constitutional: Negative for chills and fever.  Musculoskeletal: Positive for arthralgias (left ankle), gait problem (painful) and joint swelling (left lateral malleolus).  Skin: Negative for color change and wound.  Neurological: Negative for weakness and numbness.     Physical Exam Updated Vital Signs BP 119/71 (BP Location: Left Arm)   Pulse 93   Temp 99 F (37.2 C) (Oral)   Resp 14   Ht 5\' 3"  (1.6 m)   Wt 69.4 kg (153 lb)   SpO2 97%   BMI 27.10 kg/m   Physical Exam  Constitutional: She is oriented to person, place, and time. She appears well-developed and well-nourished. No  distress.  HENT:  Head: Normocephalic and atraumatic.  Eyes: Right eye exhibits no discharge. Left eye exhibits no discharge.  Pulmonary/Chest: Effort normal. No respiratory distress.  Musculoskeletal:  Left ankle with swelling and tenderness over the lateral malleolus. No ecchymosis or break in skin. No obvious deformity. Limited ankle dorsiflexion/plantar flexion due to pain. No pain to fifth metatarsal area or navicular region. Achilles intact. Good pedal pulse and cap refill of toes.Sensation grossly intact.  Neurological: She is alert and oriented to person, place, and time. Coordination normal.  Skin: Skin is warm and dry. Capillary refill takes less than 2 seconds. She is not diaphoretic.  Psychiatric: She has a normal mood and affect. Her behavior is normal.  Nursing note and vitals reviewed.    ED Treatments / Results  Labs (all labs ordered are listed, but only abnormal results are displayed) Labs Reviewed - No data to display  EKG None  Radiology Dg Ankle Complete Left  Result Date: 06/06/2018 CLINICAL DATA:  Twisted ankle. EXAM: LEFT ANKLE COMPLETE - 3+ VIEW COMPARISON:  No prior. FINDINGS: Soft tissue swelling is noted. Oblique displaced fracture of the distal fibula is noted. No other focal abnormalities identified. IMPRESSION: Soft tissue swelling. Oblique displaced fracture of the distal fibula. Electronically Signed   By: Marcello Moores  Register   On: 06/06/2018 08:20    Procedures Procedures (including critical care time)  Medications Ordered in ED Medications  oxyCODONE-acetaminophen (PERCOCET/ROXICET) 5-325 MG per tablet 1 tablet (1 tablet Oral Given 06/06/18 0815)     Initial Impression / Assessment and Plan / ED Course  I have reviewed the triage vital signs and the nursing notes.  Pertinent labs & imaging results that were available during my care of the patient were reviewed by me and considered in my medical decision making (see chart for details).     X-ray  reveals oblique displaced fracture of the distal left fibula.  Foot is neurovascularly intact.  Patient placed in posterior splint and crutches.  I have given her information to follow-up with orthopedics. Counseled her on return precautions and she agrees.   Final Clinical Impressions(s) / ED Diagnoses   Final diagnoses:  Other closed fracture of distal end of left fibula, initial encounter    ED Discharge Orders        Ordered    oxyCODONE-acetaminophen (PERCOCET/ROXICET) 5-325 MG tablet  Every 6 hours PRN     06/06/18 0842       Glyn Ade, PA-C 06/06/18 6237    Lajean Saver, MD 06/06/18 [phone]

## 2018-06-06 NOTE — ED Triage Notes (Signed)
Pt states her knee "gave out on her". Pt reports left ankle pain.

## 2018-06-06 NOTE — ED Notes (Addendum)
Ice applied to left ankle. No jewelry needed to be removed. Immobilized affected extremity.

## 2018-06-07 ENCOUNTER — Encounter (HOSPITAL_COMMUNITY): Payer: Self-pay

## 2018-06-07 ENCOUNTER — Other Ambulatory Visit: Payer: Self-pay

## 2018-06-07 ENCOUNTER — Emergency Department (HOSPITAL_COMMUNITY)
Admission: EM | Admit: 2018-06-07 | Discharge: 2018-06-07 | Disposition: A | Discharge status: Home / Self Care | Payer: 59 | Attending: Emergency Medicine | Admitting: Emergency Medicine

## 2018-06-07 DIAGNOSIS — Y9389 Activity, other specified: Secondary | ICD-10-CM | POA: Diagnosis not present

## 2018-06-07 DIAGNOSIS — Y998 Other external cause status: Secondary | ICD-10-CM | POA: Diagnosis not present

## 2018-06-07 DIAGNOSIS — Z79899 Other long term (current) drug therapy: Secondary | ICD-10-CM | POA: Insufficient documentation

## 2018-06-07 DIAGNOSIS — S82832D Other fracture of upper and lower end of left fibula, subsequent encounter for closed fracture with routine healing: Secondary | ICD-10-CM

## 2018-06-07 DIAGNOSIS — W19XXXA Unspecified fall, initial encounter: Secondary | ICD-10-CM | POA: Diagnosis not present

## 2018-06-07 DIAGNOSIS — F17210 Nicotine dependence, cigarettes, uncomplicated: Secondary | ICD-10-CM | POA: Diagnosis not present

## 2018-06-07 DIAGNOSIS — Y929 Unspecified place or not applicable: Secondary | ICD-10-CM | POA: Diagnosis not present

## 2018-06-07 DIAGNOSIS — R202 Paresthesia of skin: Secondary | ICD-10-CM | POA: Diagnosis not present

## 2018-06-07 DIAGNOSIS — S82832A Other fracture of upper and lower end of left fibula, initial encounter for closed fracture: Secondary | ICD-10-CM | POA: Insufficient documentation

## 2018-06-07 DIAGNOSIS — S99912A Unspecified injury of left ankle, initial encounter: Secondary | ICD-10-CM | POA: Diagnosis present

## 2018-06-07 MED ORDER — HYDROMORPHONE HCL 1 MG/ML IJ SOLN
1.0000 mg | Freq: Once | INTRAMUSCULAR | Status: AC
  Administered 2018-06-07: 1 mg via INTRAMUSCULAR
  Filled 2018-06-07: qty 1

## 2018-06-07 NOTE — Discharge Instructions (Addendum)
Please use your walker for ambulation. You may obtain one at a medical store using your prescription provided. You may double up your pain medication dose as needed.  Follow up with orthopedist for further care.

## 2018-06-07 NOTE — ED Provider Notes (Addendum)
[facility] DEPT Provider Note   CSN: [id number] Arrival date & time: 06/07/18  1629     History   Chief Complaint Chief Complaint  Patient presents with  . Ankle Pain    HPI [name] is a 62 y.o. female.  The history is provided by the patient and medical records. No language interpreter was used.  Ankle Pain   Associated symptoms include numbness.     62 year old female presenting with worsening left ankle pain.  Patient suffered a mechanical fall and twisted her left ankle yesterday.  She was found to have a oblique mildly displaced distal fibula fracture.  This is a closed injury.  Patient was given a posterior splint as well as crutches and opiate pain medication.  Patient report taking her Percocet as prescribed however the pain still persist.  Described as a sharp sensation radiates towards her upper leg as well as having a rubbing sensation within the splint.  She also endorsed tingling sensation to her toes.  Pain is moderate to severe.  No report of fever.  Patient mention she is unable to use crutches and prefers a walker.    Past Medical History:  Diagnosis Date  . Diverticulitis   . Environmental allergies   . GERD (gastroesophageal reflux disease)     Patient Active Problem List   Diagnosis Date Noted  . KNEE PAIN, RIGHT 10/10/2010  . CHANGE IN BOWELS 01/04/2010  . ABDOMINAL PAIN, UPPER 01/04/2010    Past Surgical History:  Procedure Laterality Date  . ABDOMINAL HYSTERECTOMY    . BLADDER SURGERY    . CHOLECYSTECTOMY    . JOINT REPLACEMENT    . TONSILLECTOMY       OB History   None      Home Medications    Prior to Admission medications   Medication Sig Start Date End Date Taking? Authorizing Provider  acetaminophen (TYLENOL) 500 MG tablet Take 1,000 mg by mouth 3 (three) times daily as needed. For pain    [provider]  citalopram (CELEXA) 40 MG tablet Take 40 mg by mouth daily.      [provider]  clonazePAM (KLONOPIN) 0.5 MG tablet Take 0.5 mg by mouth 2 (two) times daily.     [provider]  diazepam (VALIUM) 5 MG tablet Take 1 tablet (5 mg total) by mouth every 8 (eight) hours as needed for muscle spasms. 01/17/14   Ignacia Felling, PA-C  fluticasone (FLONASE) 50 MCG/ACT nasal spray Place 1 spray into the nose 3 (three) times daily. Each nostril. - Do not use this medication for more than 48 hours.  Please discard the unused portion. 09/08/11   Carmin Muskrat, MD  HYDROcodone-acetaminophen (NORCO/VICODIN) 5-325 MG per tablet Take 2 tablets by mouth every 6 (six) hours as needed. 01/17/14   Ignacia Felling, PA-C  loratadine (CLARITIN) 10 MG tablet Take 1 tablet (10 mg total) by mouth daily. 06/07/12 06/07/13  Kingsley Spittle, MD  omeprazole (PRILOSEC) 40 MG capsule Take 40 mg by mouth at bedtime.     [provider]  oxyCODONE-acetaminophen (PERCOCET/ROXICET) 5-325 MG tablet Take 1 tablet by mouth every 6 (six) hours as needed for severe pain. 06/06/18   Glyn Ade, PA-C  predniSONE (DELTASONE) 10 MG tablet Take 2 tablets (20 mg total) by mouth daily. 01/17/14   Ignacia Felling, PA-C  simvastatin (ZOCOR) 20 MG tablet Take 20 mg by mouth at bedtime.      [provider]  zolpidem (AMBIEN CR) 12.5 MG CR tablet Take 12.5 mg by mouth at bedtime as needed. For sleep    [provider]    Family History No family history on file.  Social History Social History   Tobacco Use  . Smoking status: Current Every Day Smoker    Packs/day: 0.50  . Smokeless tobacco: Never Used  Substance Use Topics  . Alcohol use: No  . Drug use: No     Allergies   Penicillins; Reglan [metoclopramide]; Sulfonamide derivatives; and Meperidine hcl   Review of Systems Review of Systems  Constitutional: Negative for fever.  Musculoskeletal: Positive for arthralgias.  Skin: Negative for wound.  Neurological: Positive for numbness.     Physical  Exam Updated Vital Signs BP (!) 128/52 (BP Location: Left Arm)   Pulse 95   Temp 98.4 F (36.9 C) (Oral)   Resp 14   Ht 5\' 3"  (1.6 m)   Wt 69.4 kg (153 lb)   SpO2 99%   BMI 27.10 kg/m   Physical Exam  Constitutional: She appears well-developed and well-nourished. No distress.  HENT:  Head: Atraumatic.  Eyes: Conjunctivae are normal.  Neck: Neck supple.  Musculoskeletal: She exhibits tenderness (Left leg: Splint was removed, normal skin tone, mild bruising noted to lateral malleoli region with tenderness to palpation.  Dorsalis pedis pulse palpable with brisk cap refill and sensation intact throughout.  Leg compartment soft).  Neurological: She is alert.  Skin: No rash noted.  Psychiatric: She has a normal mood and affect.  Nursing note and vitals reviewed.    ED Treatments / Results  Labs (all labs ordered are listed, but only abnormal results are displayed) Labs Reviewed - No data to display  EKG None  Radiology Dg Ankle Complete Left  Result Date: 06/06/2018 CLINICAL DATA:  Twisted ankle. EXAM: LEFT ANKLE COMPLETE - 3+ VIEW COMPARISON:  No prior. FINDINGS: Soft tissue swelling is noted. Oblique displaced fracture of the distal fibula is noted. No other focal abnormalities identified. IMPRESSION: Soft tissue swelling. Oblique displaced fracture of the distal fibula. Electronically Signed   By: Marcello Moores  Register   On: 06/06/2018 08:20    Procedures Procedures (including critical care time)  SPLINT APPLICATION Date/Time: 8:24 PM Authorized by: Domenic Moras Consent: Verbal consent obtained. Risks and benefits: risks, benefits and alternatives were discussed Consent given by: patient Splint applied by: orthopedic technician Location details: L ankle Splint type: posterior splint Supplies used: plaster Post-procedure: The splinted body part was neurovascularly unchanged following the procedure. Patient tolerance: Patient tolerated the procedure well with no immediate  complications.     Medications Ordered in ED Medications - No data to display   Initial Impression / Assessment and Plan / ED Course  I have reviewed the triage vital signs and the nursing notes.  Pertinent labs & imaging results that were available during my care of the patient were reviewed by me and considered in my medical decision making (see chart for details).     BP (!) 128/52 (BP Location: Left Arm)   Pulse 95   Temp 98.4 F (36.9 C) (Oral)   Resp 14   Ht 5\' 3"  (1.6 m)   Wt 69.4 kg (153 lb)   SpO2 99%   BMI 27.10 kg/m    Final Clinical Impressions(s) / ED Diagnoses   Final diagnoses:  Closed fracture of distal end of left fibula with routine healing, subsequent encounter    ED Discharge Orders  Ordered    Walker standard     06/07/18 2033     8:28 PM Patient here with pain to her left ankle after being diagnosed with a oblique mildly displaced closed distal fibula fracture on the left from mechanical injury yesterday.  Her posterior splint was removed.  No evidence of compartment syndrome, no skin breakdown.  Patient is neurovascular intact.  The splint was reapplied.  Patient given prescription for walker.  Encourage patient to continue taking pain medication keep it elevated and follow-up with outpatient orthopedics for further care.  Patient received a dose of pain medication here.   Domenic Moras, PA-C 06/07/18 2034    Domenic Moras, PA-C 06/07/18 2034    Margette Fast, MD 06/08/18 [phone]

## 2018-06-07 NOTE — ED Triage Notes (Signed)
Pt states that she was here 8/1 and broke her ankle. Pt states that she is in pain. Pt able to move toes. Good cap refill.

## 2018-06-07 NOTE — ED Notes (Signed)
Ortho tech called to assess LLE splint

## 2018-06-17 ENCOUNTER — Encounter (HOSPITAL_COMMUNITY): Payer: Self-pay | Admitting: *Deleted

## 2018-06-17 ENCOUNTER — Emergency Department (HOSPITAL_COMMUNITY): Payer: 59

## 2018-06-17 ENCOUNTER — Other Ambulatory Visit: Payer: Self-pay

## 2018-06-17 ENCOUNTER — Emergency Department (HOSPITAL_COMMUNITY)
Admission: EM | Admit: 2018-06-17 | Discharge: 2018-06-18 | Disposition: A | Discharge status: Home / Self Care | Payer: 59 | Attending: Emergency Medicine | Admitting: Emergency Medicine

## 2018-06-17 DIAGNOSIS — R1084 Generalized abdominal pain: Secondary | ICD-10-CM

## 2018-06-17 DIAGNOSIS — Z79899 Other long term (current) drug therapy: Secondary | ICD-10-CM | POA: Diagnosis not present

## 2018-06-17 DIAGNOSIS — F17200 Nicotine dependence, unspecified, uncomplicated: Secondary | ICD-10-CM | POA: Insufficient documentation

## 2018-06-17 DIAGNOSIS — K5903 Drug induced constipation: Secondary | ICD-10-CM | POA: Diagnosis not present

## 2018-06-17 LAB — CBC
HCT: 38.8 % (ref 36.0–46.0)
HEMOGLOBIN: 13 g/dL (ref 12.0–15.0)
MCH: 30.7 pg (ref 26.0–34.0)
MCHC: 33.5 g/dL (ref 30.0–36.0)
MCV: 91.7 fL (ref 78.0–100.0)
Platelets: 228 10*3/uL (ref 150–400)
RBC: 4.23 MIL/uL (ref 3.87–5.11)
RDW: 14.3 % (ref 11.5–15.5)
WBC: 8.2 10*3/uL (ref 4.0–10.5)

## 2018-06-17 LAB — COMPREHENSIVE METABOLIC PANEL
ALBUMIN: 3.9 g/dL (ref 3.5–5.0)
ALT: 118 U/L — ABNORMAL HIGH (ref 0–44)
AST: 60 U/L — AB (ref 15–41)
Alkaline Phosphatase: 160 U/L — ABNORMAL HIGH (ref 38–126)
Anion gap: 11 (ref 5–15)
BUN: 18 mg/dL (ref 8–23)
CHLORIDE: 102 mmol/L (ref 98–111)
CO2: 26 mmol/L (ref 22–32)
Calcium: 9.5 mg/dL (ref 8.9–10.3)
Creatinine, Ser: 0.7 mg/dL (ref 0.44–1.00)
GFR calc Af Amer: >60 mL/min (ref >60)
GFR calc non Af Amer: >60 mL/min (ref >60)
GLUCOSE: 122 mg/dL — AB (ref 70–99)
POTASSIUM: 3.8 mmol/L (ref 3.5–5.1)
Sodium: 139 mmol/L (ref 135–145)
Total Bilirubin: 0.6 mg/dL (ref 0.3–1.2)
Total Protein: 8.6 g/dL — ABNORMAL HIGH (ref 6.5–8.1)

## 2018-06-17 LAB — LIPASE, BLOOD: Lipase: 24 U/L (ref 11–51)

## 2018-06-17 MED ORDER — DICYCLOMINE HCL 10 MG PO CAPS
10.0000 mg | ORAL_CAPSULE | Freq: Once | ORAL | Status: AC
  Administered 2018-06-17: 10 mg via ORAL
  Filled 2018-06-17: qty 1

## 2018-06-17 MED ORDER — IOPAMIDOL (ISOVUE-300) INJECTION 61%
INTRAVENOUS | Status: AC
  Filled 2018-06-17: qty 100

## 2018-06-17 MED ORDER — FLEET ENEMA 7-19 GM/118ML RE ENEM
1.0000 | ENEMA | Freq: Once | RECTAL | Status: AC
  Administered 2018-06-17: 1 via RECTAL
  Filled 2018-06-17: qty 1

## 2018-06-17 MED ORDER — MAGNESIUM CITRATE PO SOLN
1.0000 | Freq: Once | ORAL | Status: AC
  Administered 2018-06-17: 1 via ORAL
  Filled 2018-06-17: qty 296

## 2018-06-17 MED ORDER — FENTANYL CITRATE (PF) 100 MCG/2ML IJ SOLN
50.0000 ug | Freq: Once | INTRAMUSCULAR | Status: AC
  Administered 2018-06-18: 50 ug via INTRAVENOUS
  Filled 2018-06-17: qty 2

## 2018-06-17 MED ORDER — IOPAMIDOL (ISOVUE-300) INJECTION 61%
100.0000 mL | Freq: Once | INTRAVENOUS | Status: AC | PRN
  Administered 2018-06-17: 100 mL via INTRAVENOUS

## 2018-06-17 MED ORDER — MILK AND MOLASSES ENEMA
1.0000 | Freq: Once | RECTAL | Status: AC
  Administered 2018-06-17: 250 mL via RECTAL
  Filled 2018-06-17: qty 250

## 2018-06-17 MED ORDER — OXYCODONE-ACETAMINOPHEN 5-325 MG PO TABS
1.0000 | ORAL_TABLET | Freq: Once | ORAL | Status: AC
Start: 2018-06-17 — End: 2018-06-17
  Administered 2018-06-17: 1 via ORAL
  Filled 2018-06-17: qty 1

## 2018-06-17 NOTE — ED Provider Notes (Signed)
[facility] DEPT Provider Note   CSN: [id number] Arrival date & time: 06/17/18  1407     History   Chief Complaint Chief Complaint  Patient presents with  . Constipation    HPI [name] is a 62 y.o. female.  62yo F w/ PMH including diverticulitis, GERD who p/w constipation. Pt had recent left leg surgery and has been taking oxycodone for pain. She has had constipation because of the pain medications, last bowel movement was 4 days ago.  She tried Colace with no relief.  She reports significant abdominal discomfort because of the constipation.  No fevers or problems with urination.  The history is provided by the patient.  Constipation      Past Medical History:  Diagnosis Date  . Diverticulitis   . Environmental allergies   . GERD (gastroesophageal reflux disease)     Patient Active Problem List   Diagnosis Date Noted  . KNEE PAIN, RIGHT 10/10/2010  . CHANGE IN BOWELS 01/04/2010  . ABDOMINAL PAIN, UPPER 01/04/2010    Past Surgical History:  Procedure Laterality Date  . ABDOMINAL HYSTERECTOMY    . BLADDER SURGERY    . CHOLECYSTECTOMY    . FRACTURE SURGERY Left    ankle surgery  . JOINT REPLACEMENT    . TONSILLECTOMY       OB History   None      Home Medications    Prior to Admission medications   Medication Sig Start Date End Date Taking? Authorizing Provider  acetaminophen (TYLENOL) 500 MG tablet Take 1,000 mg by mouth 3 (three) times daily as needed. For pain    [provider]  citalopram (CELEXA) 40 MG tablet Take 40 mg by mouth daily.      [provider]  clonazePAM (KLONOPIN) 0.5 MG tablet Take 0.5 mg by mouth 2 (two) times daily.     [provider]  diazepam (VALIUM) 5 MG tablet Take 1 tablet (5 mg total) by mouth every 8 (eight) hours as needed for muscle spasms. 01/17/14   Ignacia Felling, PA-C  fluticasone (FLONASE) 50 MCG/ACT nasal spray Place 1 spray into the nose 3 (three) times  daily. Each nostril. - Do not use this medication for more than 48 hours.  Please discard the unused portion. 09/08/11   Carmin Muskrat, MD  HYDROcodone-acetaminophen (NORCO/VICODIN) 5-325 MG per tablet Take 2 tablets by mouth every 6 (six) hours as needed. 01/17/14   Ignacia Felling, PA-C  loratadine (CLARITIN) 10 MG tablet Take 1 tablet (10 mg total) by mouth daily. 06/07/12 06/07/13  Kingsley Spittle, MD  omeprazole (PRILOSEC) 40 MG capsule Take 40 mg by mouth at bedtime.     [provider]  oxyCODONE-acetaminophen (PERCOCET/ROXICET) 5-325 MG tablet Take 1 tablet by mouth every 6 (six) hours as needed for severe pain. 06/06/18   Glyn Ade, PA-C  polyethylene glycol powder (GLYCOLAX/MIRALAX) powder Mix 1 capful in drink and take by mouth 1 to 3 times daily as needed for soft stools OTC 06/18/18   Cierrah Dace, Wenda Overland, MD  predniSONE (DELTASONE) 10 MG tablet Take 2 tablets (20 mg total) by mouth daily. 01/17/14   Ignacia Felling, PA-C  simvastatin (ZOCOR) 20 MG tablet Take 20 mg by mouth at bedtime.      [provider]  zolpidem (AMBIEN CR) 12.5 MG CR tablet Take 12.5 mg by mouth at bedtime as needed. For sleep    [provider]    Family History No family history  on file.  Social History Social History   Tobacco Use  . Smoking status: Current Every Day Smoker    Packs/day: 0.50  . Smokeless tobacco: Never Used  Substance Use Topics  . Alcohol use: No  . Drug use: No     Allergies   Penicillins; Reglan [metoclopramide]; Sulfonamide derivatives; and Meperidine hcl   Review of Systems Review of Systems  Gastrointestinal: Positive for constipation.   All other systems reviewed and are negative except that which was mentioned in HPI   Physical Exam Updated Vital Signs BP 107/71 (BP Location: Right Arm)   Pulse 97   Temp 98.3 F (36.8 C) (Oral)   Resp 20   Ht 5\' 3"  (1.6 m)   Wt 69.4 kg   SpO2 97%   BMI 27.10 kg/m   Physical Exam    Constitutional: She is oriented to person, place, and time. She appears well-developed and well-nourished.  uncomfortable  HENT:  Head: Normocephalic and atraumatic.  Moist mucous membranes  Eyes: Conjunctivae are normal.  Neck: Neck supple.  Cardiovascular: Normal rate, regular rhythm and normal heart sounds.  No murmur heard. Pulmonary/Chest: Effort normal and breath sounds normal.  Abdominal: Soft. Bowel sounds are normal. She exhibits distension (mild). There is no tenderness.  Musculoskeletal:  L leg in splint, normal cap refill toes  Neurological: She is alert and oriented to person, place, and time.  Fluent speech  Skin: Skin is warm and dry.  Psychiatric:  Anxious, distressed  Nursing note and vitals reviewed.    ED Treatments / Results  Labs (all labs ordered are listed, but only abnormal results are displayed) Labs Reviewed  COMPREHENSIVE METABOLIC PANEL - Abnormal; Notable for the following components:      Result Value   Glucose, Bld 122 (*)    Total Protein 8.6 (*)    AST 60 (*)    ALT 118 (*)    Alkaline Phosphatase 160 (*)    All other components within normal limits  LIPASE, BLOOD  CBC    EKG None  Radiology Ct Abdomen Pelvis W Contrast  Result Date: 06/18/2018 CLINICAL DATA:  62 y/o F; 3 days of constipation since left leg surgery. EXAM: CT ABDOMEN AND PELVIS WITH CONTRAST TECHNIQUE: Multidetector CT imaging of the abdomen and pelvis was performed using the standard protocol following bolus administration of intravenous contrast. CONTRAST:  100 cc Isovue-300 COMPARISON:  03/26/2014 CT abdomen FINDINGS: Lower chest: No acute abnormality. Platelike atelectasis in the lung bases. Hepatobiliary: No focal liver abnormality is seen. Status post cholecystectomy. Mildly increased intra and extrahepatic biliary ductal dilatation is stable, likely compensatory post cholecystectomy. No distal obstructing radiopaque stone or mass of the common bile duct identified.  Pancreas: Unremarkable. No pancreatic ductal dilatation or surrounding inflammatory changes. Spleen: Normal in size without focal abnormality. Adrenals/Urinary Tract: Normal adrenal glands. 10 mm right kidney interpolar cyst. No additional kidney lesion identified. No hydronephrosis or urinary stone disease. Normal bladder. Stomach/Bowel: Stomach is within normal limits. Appendix appears normal. No evidence of bowel wall thickening, distention, or inflammatory changes. Mild sigmoid diverticulosis without findings of acute diverticulitis. Vascular/Lymphatic: Aortic atherosclerosis. No enlarged abdominal or pelvic lymph nodes. Multiple stable surgical clips are present along the course of the IVC. Reproductive: Status post hysterectomy. No adnexal masses. Other: No abdominal wall hernia or abnormality. No abdominopelvic ascites. Musculoskeletal: Moderate L4-5 discogenic degenerative changes. No acute osseous abnormality identified. IMPRESSION: 1. No obstructive or inflammatory changes of the bowel. 2. Mild interval increase in intra and  extrahepatic biliary ductal dilatation, likely compensatory post cholecystectomy. 3. Mild sigmoid diverticulosis, no findings of acute diverticulitis. Electronically Signed   By: Kristine Garbe M.D.   On: 06/18/2018 00:17    Procedures Procedures (including critical care time)  Medications Ordered in ED Medications  sodium phosphate (FLEET) 7-19 GM/118ML enema 1 enema (1 enema Rectal Given 06/17/18 1821)  magnesium citrate solution 1 Bottle (1 Bottle Oral Given 06/17/18 1821)  milk and molasses enema (250 mLs Rectal Given 06/17/18 2117)  oxyCODONE-acetaminophen (PERCOCET/ROXICET) 5-325 MG per tablet 1 tablet (1 tablet Oral Given 06/17/18 2116)  dicyclomine (BENTYL) capsule 10 mg (10 mg Oral Given 06/17/18 2231)  fentaNYL (SUBLIMAZE) injection 50 mcg (50 mcg Intravenous Given 06/18/18 0025)  iopamidol (ISOVUE-300) 61 % injection 100 mL (100 mLs Intravenous Contrast  Given 06/17/18 2349)     Initial Impression / Assessment and Plan / ED Course  I have reviewed the triage vital signs and the nursing notes.     Patient without focal abdominal tenderness on exam.  No vomiting to suggest bowel obstruction.  Gave Fleet enema and magnesium citrate in the ED. passed some hard stool but continued to endorse abdominal pain.  Tried milk and molasses enema but patient was unable to retain it.  Because of ongoing symptoms, obtain labs and imaging to rule out obstruction or other life-threatening process.  Labs show mild elevation of AST/ALT with normal bilirubin. Normal lipase and normal CBC.  She has history of cholecystectomy and has no focal right upper quadrant tenderness to suggest biliary pathology.  CT shows postcholecystectomy dilatation of the biliary system, expected from previous surgery,. She has no other acute findings aside from stool burden.  She continues to have crampy pain and bloating but has had no vomiting here and has tolerated magnesium citrate and medications.  I recommended bowel cleanout with MiraLAX followed by daily MiraLAX and Colace.  Discussed other supportive measures and instructed to see PCP in 2 days for reassessment.  I have extensively reviewed return precautions with the patient and her family member.  They voiced understanding.  Final Clinical Impressions(s) / ED Diagnoses   Final diagnoses:  Drug-induced constipation  Generalized abdominal pain    ED Discharge Orders         Ordered    polyethylene glycol powder (GLYCOLAX/MIRALAX) powder     06/18/18 0039           Mahiya Kercheval, Wenda Overland, MD 06/18/18 [phone]

## 2018-06-17 NOTE — ED Notes (Signed)
Patient transported to CT 

## 2018-06-17 NOTE — ED Notes (Signed)
Per pt request, pt given an ice pack for leg pain.

## 2018-06-17 NOTE — ED Notes (Signed)
Small amount of bowel movement noted post-enema.

## 2018-06-17 NOTE — ED Triage Notes (Signed)
Pt states she's had constipation x 3 days since she's had her left leg surgery.  Pt has been taking oxycodone since then d/t the pain.  Pt reports she's been taking a stool softener.  Last BM was 4 days ago.  Pt a/o x 4.

## 2018-06-18 MED ORDER — POLYETHYLENE GLYCOL 3350 17 GM/SCOOP PO POWD: ORAL | 1 refills | Status: DC

## 2018-06-18 NOTE — Discharge Instructions (Addendum)
Mix 6 capfuls of miralax in 32 oz of liquid (water, gatorade, etc) and drink tomorrow morning over 4 hours.  Take colace 100mg  (1 capsule) daily. Take miralax 1 capful 1 to 3 times daily as needed for constipation. Drink plenty of fluids. If you continue to have constipation even with miralax, you may take another bottle of magnesium citrate or add SENNA (over the counter, take 1 tablet nightly) to your medications.  Return to ER if you have any fevers, vomiting, bloody stools, or worsening pain.

## 2018-09-20 DIAGNOSIS — M5416 Radiculopathy, lumbar region: Secondary | ICD-10-CM | POA: Insufficient documentation

## 2018-09-20 HISTORY — DX: Radiculopathy, lumbar region: M54.16

## 2018-12-21 ENCOUNTER — Emergency Department
Admission: EM | Admit: 2018-12-21 | Discharge: 2018-12-21 | Disposition: A | Discharge status: Home / Self Care | Payer: 59 | Attending: Emergency Medicine | Admitting: Emergency Medicine

## 2018-12-21 ENCOUNTER — Encounter: Payer: Self-pay | Admitting: Emergency Medicine

## 2018-12-21 ENCOUNTER — Emergency Department: Payer: 59

## 2018-12-21 DIAGNOSIS — Z79899 Other long term (current) drug therapy: Secondary | ICD-10-CM | POA: Diagnosis not present

## 2018-12-21 DIAGNOSIS — F17210 Nicotine dependence, cigarettes, uncomplicated: Secondary | ICD-10-CM | POA: Diagnosis not present

## 2018-12-21 DIAGNOSIS — F419 Anxiety disorder, unspecified: Secondary | ICD-10-CM | POA: Diagnosis not present

## 2018-12-21 DIAGNOSIS — R0789 Other chest pain: Secondary | ICD-10-CM | POA: Diagnosis present

## 2018-12-21 LAB — CBC
HCT: 35.4 % — ABNORMAL LOW (ref 36.0–46.0)
HEMOGLOBIN: 11.2 g/dL — AB (ref 12.0–15.0)
MCH: 28.9 pg (ref 26.0–34.0)
MCHC: 31.6 g/dL (ref 30.0–36.0)
MCV: 91.5 fL (ref 80.0–100.0)
Platelets: 258 10*3/uL (ref 150–400)
RBC: 3.87 MIL/uL (ref 3.87–5.11)
RDW: 14.6 % (ref 11.5–15.5)
WBC: 6.9 10*3/uL (ref 4.0–10.5)
nRBC: 0 % (ref 0.0–0.2)

## 2018-12-21 LAB — BASIC METABOLIC PANEL
ANION GAP: 5 (ref 5–15)
BUN: 12 mg/dL (ref 8–23)
CO2: 27 mmol/L (ref 22–32)
Calcium: 8.7 mg/dL — ABNORMAL LOW (ref 8.9–10.3)
Chloride: 104 mmol/L (ref 98–111)
Creatinine, Ser: 0.79 mg/dL (ref 0.44–1.00)
GFR calc Af Amer: >60 mL/min (ref >60)
GLUCOSE: 101 mg/dL — AB (ref 70–99)
POTASSIUM: 4.4 mmol/L (ref 3.5–5.1)
Sodium: 136 mmol/L (ref 135–145)

## 2018-12-21 LAB — TROPONIN I

## 2018-12-21 NOTE — Discharge Instructions (Addendum)
Return to the ER for new, worsening, or persistent chest pain, difficulty breathing, weakness or lightheadedness, or any other new or worsening symptoms that concern you.

## 2018-12-21 NOTE — ED Provider Notes (Signed)
[facility] Emergency Department Provider Note ____________________________________________   First MD Initiated Contact with Patient 12/21/18 1002     (approximate)  I have reviewed the triage vital signs and the nursing notes.   HISTORY  Chief Complaint Chest Pain and Anxiety    HPI [name] is a 63 y.o. female with PMH as noted below as well as a history of anxiety who presents with chest discomfort, acute onset this morning several hours ago, described as tightness, and occurring after she had an argument with her son.  The patient states that she has been under a great deal of stress due to her recent home purchase and her son's mental illness.  The patient states that she feels like she is having a panic attack.  She states she has similar symptoms a few times in the past and was told that it was panic at that time.  She endorses acute anxiety.  She denies any shortness of breath, lightheadedness, nausea, or fever.   Past Medical History:  Diagnosis Date  . Diverticulitis   . Environmental allergies   . GERD (gastroesophageal reflux disease)     Patient Active Problem List   Diagnosis Date Noted  . KNEE PAIN, RIGHT 10/10/2010  . CHANGE IN BOWELS 01/04/2010  . ABDOMINAL PAIN, UPPER 01/04/2010    Past Surgical History:  Procedure Laterality Date  . ABDOMINAL HYSTERECTOMY    . BLADDER SURGERY    . CHOLECYSTECTOMY    . FRACTURE SURGERY Left    ankle surgery  . JOINT REPLACEMENT    . TONSILLECTOMY      Prior to Admission medications   Medication Sig Start Date End Date Taking? Authorizing Provider  escitalopram (LEXAPRO) 20 MG tablet Take 30 mg by mouth daily. 06/11/17  Yes [provider]  gabapentin (NEURONTIN) 300 MG capsule Take 900 mg by mouth at bedtime. 02/20/12  Yes [provider]  montelukast (SINGULAIR) 10 MG tablet Take 10 mg by mouth Nightly. 06/11/17  Yes [provider]  pantoprazole (PROTONIX) 40 MG  tablet Take 40 mg by mouth 2 (two) times daily. 06/11/17  Yes [provider]  acetaminophen (TYLENOL) 500 MG tablet Take 1,000 mg by mouth 3 (three) times daily as needed. For pain    [provider]  amitriptyline (ELAVIL) 150 MG tablet Take 150 mg by mouth Nightly. 12/12/18   [provider]  Cholecalciferol (VITAMIN D) 50 MCG (2000 UT) tablet Take 2,000 Units by mouth daily.    [provider]  citalopram (CELEXA) 40 MG tablet Take 40 mg by mouth daily.      [provider]  clonazePAM (KLONOPIN) 0.5 MG tablet Take 0.5 mg by mouth 2 (two) times daily.     [provider]  diazepam (VALIUM) 5 MG tablet Take 1 tablet (5 mg total) by mouth every 8 (eight) hours as needed for muscle spasms. 01/17/14   Ignacia Felling, PA-C  fluticasone (FLONASE) 50 MCG/ACT nasal spray Place 1 spray into the nose 3 (three) times daily. Each nostril. - Do not use this medication for more than 48 hours.  Please discard the unused portion. 09/08/11   Carmin Muskrat, MD  HYDROcodone-acetaminophen (NORCO/VICODIN) 5-325 MG per tablet Take 2 tablets by mouth every 6 (six) hours as needed. 01/17/14   Ignacia Felling, PA-C  loratadine (CLARITIN) 10 MG tablet Take 1 tablet (10 mg total) by mouth daily. 06/07/12 06/07/13  Kingsley Spittle, MD  magic mouthwash SOLN Take 30 mLs  by mouth 4 (four) times daily. 11/11/18   [provider]  omeprazole (PRILOSEC) 40 MG capsule Take 40 mg by mouth at bedtime.     [provider]  oxyCODONE-acetaminophen (PERCOCET/ROXICET) 5-325 MG tablet Take 1 tablet by mouth every 6 (six) hours as needed for severe pain. 06/06/18   Glyn Ade, PA-C  polyethylene glycol powder (GLYCOLAX/MIRALAX) powder Mix 1 capful in drink and take by mouth 1 to 3 times daily as needed for soft stools OTC 06/18/18   Little, Wenda Overland, MD  predniSONE (DELTASONE) 10 MG tablet Take 2 tablets (20 mg total) by mouth daily. 01/17/14   Ignacia Felling, PA-C    simvastatin (ZOCOR) 20 MG tablet Take 20 mg by mouth at bedtime.      [provider]  zolpidem (AMBIEN CR) 12.5 MG CR tablet Take 12.5 mg by mouth at bedtime as needed. For sleep    [provider]    Allergies Penicillins; Reglan [metoclopramide]; Sulfonamide derivatives; and Meperidine hcl  No family history on file.  Social History Social History   Tobacco Use  . Smoking status: Current Every Day Smoker    Packs/day: 0.50  . Smokeless tobacco: Never Used  Substance Use Topics  . Alcohol use: No  . Drug use: No    Review of Systems  Constitutional: No fever. Eyes: No visual changes. ENT: No neck pain. Cardiovascular: Positive for chest pain. Respiratory: Denies shortness of breath. Gastrointestinal: No nausea, vomiting, or diarrhea.  Genitourinary: Negative for flank pain.  Musculoskeletal: Negative for back pain. Skin: Negative for rash. Neurological: Negative for headache.   ____________________________________________   PHYSICAL EXAM:  VITAL SIGNS: ED Triage Vitals  Enc Vitals Group     BP 12/21/18 0937 126/63     Pulse Rate 12/21/18 0937 97     Resp 12/21/18 0937 18     Temp 12/21/18 0937 98.6 F (37 C)     Temp Source 12/21/18 0937 Oral     SpO2 12/21/18 0937 99 %     Weight 12/21/18 0938 143 lb (64.9 kg)     Height 12/21/18 0938 5\' 3"  (1.6 m)     Head Circumference --      Peak Flow --      Pain Score 12/21/18 0938 6     Pain Loc --      Pain Edu? --      Excl. in [place]? --     Constitutional: Alert and oriented. Well appearing and in no acute distress. Eyes: Conjunctivae are normal.  Head: Atraumatic. Nose: No congestion/rhinnorhea. Mouth/Throat: Mucous membranes are moist.   Neck: Normal range of motion.  Cardiovascular: Normal rate, regular rhythm. Grossly normal heart sounds.  Good peripheral circulation. Respiratory: Normal respiratory effort.  No retractions. Lungs CTAB. Gastrointestinal: No distention.   Musculoskeletal: Extremities warm and well perfused.  Neurologic:  Normal speech and language. No gross focal neurologic deficits are appreciated.  Skin:  Skin is warm and dry. No rash noted. Psychiatric: Slightly anxious appearing.  Speech and behavior are normal.  ____________________________________________   LABS (all labs ordered are listed, but only abnormal results are displayed)  Labs Reviewed  BASIC METABOLIC PANEL - Abnormal; Notable for the following components:      Result Value   Glucose, Bld 101 (*)    Calcium 8.7 (*)    All other components within normal limits  CBC - Abnormal; Notable for the following components:   Hemoglobin 11.2 (*)    HCT 35.4 (*)  All other components within normal limits  TROPONIN I  TROPONIN I   ____________________________________________  EKG  ED ECG REPORT I, Arta Silence, the attending physician, personally viewed and interpreted this ECG.  Date: 12/21/2018 EKG Time: 09 34 Rate: 102 Rhythm: Sinus tachycardia QRS Axis: normal Intervals: normal ST/T Wave abnormalities: normal Narrative Interpretation: no evidence of acute ischemia  ____________________________________________  RADIOLOGY  CXR: No focal infiltrate or other acute abnormality  ____________________________________________   PROCEDURES  Procedure(s) performed: No  Procedures  Critical Care performed: No ____________________________________________   INITIAL IMPRESSION / ASSESSMENT AND PLAN / ED COURSE  Pertinent labs & imaging results that were available during my care of the patient were reviewed by me and considered in my medical decision making (see chart for details).  63 year old female with PMH as noted above and no significant prior cardiac history presents with atypical chest pain this morning after an argument with her son.  The patient states that she has been under a great deal of stress due to the son's apparent mental illness as  well as other recent stressors.  The patient states she feels like she is having a panic attack.  I reviewed the past medical records in [place].  The patient has had no prior ED visits or admissions within the last few months.  On exam the patient is slightly anxious but overall well-appearing.  Her vital signs are normal.  The remainder of the exam is unremarkable.  EKG is nonischemic.  Overall based on the patient's description of the symptoms and her lack of cardiac risk factors, the presentation is consistent with anxiety/panic.  I have a low suspicion for ACS.  The symptoms are also not consistent with PE.  Although I cannot rule her out by [facility] rule because of her age and borderline tachycardia, there are no clinical signs or symptoms to suggest DVT or PE.  We will obtain basic labs, troponin, chest x-ray, and reassess.  ----------------------------------------- 3:16 PM on 12/21/2018 -----------------------------------------  Lab work-up including troponin x2 was negative.  The patient's symptoms resolved without further intervention.  She was stable for discharge home.  Return precautions given, and she expressed understanding. ____________________________________________   FINAL CLINICAL IMPRESSION(S) / ED DIAGNOSES  Final diagnoses:  Anxiety  Atypical chest pain      NEW MEDICATIONS STARTED DURING THIS VISIT:  Discharge Medication List as of 12/21/2018  1:12 PM       Note:  This document was prepared using Dragon voice recognition software and may include unintentional dictation errors.    Arta Silence, MD 12/21/18 1517

## 2018-12-21 NOTE — ED Triage Notes (Signed)
Patient presents to the ED with chest pain and anxiety.  Patient states, "I think I'm having a panic attack, but I want to make sure."  Patient states she has a lot of anxiety due to her son.  Patient states, "he's messed up".  Patient reports history of panic attacks and states this feels similar.

## 2019-03-11 DIAGNOSIS — H90A21 Sensorineural hearing loss, unilateral, right ear, with restricted hearing on the contralateral side: Secondary | ICD-10-CM | POA: Insufficient documentation

## 2019-03-11 DIAGNOSIS — H9313 Tinnitus, bilateral: Secondary | ICD-10-CM

## 2019-03-11 HISTORY — DX: Sensorineural hearing loss, unilateral, right ear, with restricted hearing on the contralateral side: H90.A21

## 2019-03-11 HISTORY — DX: Tinnitus, bilateral: H93.13

## 2019-04-02 ENCOUNTER — Other Ambulatory Visit: Payer: Self-pay | Admitting: Otolaryngology

## 2019-04-02 DIAGNOSIS — H9041 Sensorineural hearing loss, unilateral, right ear, with unrestricted hearing on the contralateral side: Secondary | ICD-10-CM

## 2019-06-30 DIAGNOSIS — M5416 Radiculopathy, lumbar region: Secondary | ICD-10-CM | POA: Diagnosis not present

## 2019-06-30 DIAGNOSIS — M533 Sacrococcygeal disorders, not elsewhere classified: Secondary | ICD-10-CM | POA: Diagnosis not present

## 2019-06-30 DIAGNOSIS — M47816 Spondylosis without myelopathy or radiculopathy, lumbar region: Secondary | ICD-10-CM | POA: Diagnosis not present

## 2019-07-18 DIAGNOSIS — Z130 Encounter for screening for diseases of the blood and blood-forming organs and certain disorders involving the immune mechanism: Secondary | ICD-10-CM | POA: Diagnosis not present

## 2019-07-18 DIAGNOSIS — Z0000 Encounter for general adult medical examination without abnormal findings: Secondary | ICD-10-CM | POA: Diagnosis not present

## 2019-07-18 DIAGNOSIS — Z13228 Encounter for screening for other metabolic disorders: Secondary | ICD-10-CM | POA: Diagnosis not present

## 2019-07-18 DIAGNOSIS — Z1329 Encounter for screening for other suspected endocrine disorder: Secondary | ICD-10-CM | POA: Diagnosis not present

## 2019-07-18 DIAGNOSIS — Z13220 Encounter for screening for lipoid disorders: Secondary | ICD-10-CM | POA: Diagnosis not present

## 2019-07-18 DIAGNOSIS — R69 Illness, unspecified: Secondary | ICD-10-CM | POA: Diagnosis not present

## 2019-07-21 DIAGNOSIS — M47816 Spondylosis without myelopathy or radiculopathy, lumbar region: Secondary | ICD-10-CM | POA: Diagnosis not present

## 2019-07-21 DIAGNOSIS — M5416 Radiculopathy, lumbar region: Secondary | ICD-10-CM | POA: Diagnosis not present

## 2019-07-21 DIAGNOSIS — M533 Sacrococcygeal disorders, not elsewhere classified: Secondary | ICD-10-CM | POA: Diagnosis not present

## 2019-08-15 DIAGNOSIS — S2341XA Sprain of ribs, initial encounter: Secondary | ICD-10-CM | POA: Diagnosis not present

## 2019-08-15 DIAGNOSIS — R0781 Pleurodynia: Secondary | ICD-10-CM | POA: Diagnosis not present

## 2019-09-19 DIAGNOSIS — M47816 Spondylosis without myelopathy or radiculopathy, lumbar region: Secondary | ICD-10-CM | POA: Diagnosis not present

## 2019-10-24 DIAGNOSIS — M47816 Spondylosis without myelopathy or radiculopathy, lumbar region: Secondary | ICD-10-CM | POA: Diagnosis not present

## 2019-10-24 HISTORY — DX: Spondylosis without myelopathy or radiculopathy, lumbar region: M47.816

## 2019-11-06 DIAGNOSIS — M47816 Spondylosis without myelopathy or radiculopathy, lumbar region: Secondary | ICD-10-CM | POA: Diagnosis not present

## 2019-11-19 DIAGNOSIS — G894 Chronic pain syndrome: Secondary | ICD-10-CM | POA: Diagnosis not present

## 2019-11-19 DIAGNOSIS — M533 Sacrococcygeal disorders, not elsewhere classified: Secondary | ICD-10-CM | POA: Diagnosis not present

## 2019-11-19 DIAGNOSIS — M47816 Spondylosis without myelopathy or radiculopathy, lumbar region: Secondary | ICD-10-CM | POA: Diagnosis not present

## 2019-11-22 DIAGNOSIS — R69 Illness, unspecified: Secondary | ICD-10-CM | POA: Diagnosis not present

## 2019-11-22 DIAGNOSIS — Z760 Encounter for issue of repeat prescription: Secondary | ICD-10-CM | POA: Diagnosis not present

## 2019-11-27 DIAGNOSIS — M47816 Spondylosis without myelopathy or radiculopathy, lumbar region: Secondary | ICD-10-CM | POA: Diagnosis not present

## 2019-12-22 ENCOUNTER — Other Ambulatory Visit: Payer: Self-pay

## 2019-12-22 ENCOUNTER — Encounter: Payer: Self-pay | Admitting: *Deleted

## 2019-12-22 ENCOUNTER — Emergency Department
Admission: EM | Admit: 2019-12-22 | Discharge: 2019-12-22 | Disposition: A | Discharge status: Home / Self Care | Payer: 59 | Attending: Emergency Medicine | Admitting: Emergency Medicine

## 2019-12-22 DIAGNOSIS — M545 Low back pain: Secondary | ICD-10-CM | POA: Diagnosis present

## 2019-12-22 DIAGNOSIS — R69 Illness, unspecified: Secondary | ICD-10-CM | POA: Diagnosis not present

## 2019-12-22 DIAGNOSIS — M5431 Sciatica, right side: Secondary | ICD-10-CM | POA: Insufficient documentation

## 2019-12-22 DIAGNOSIS — Z79899 Other long term (current) drug therapy: Secondary | ICD-10-CM | POA: Diagnosis not present

## 2019-12-22 DIAGNOSIS — Z9660 Presence of unspecified orthopedic joint implant: Secondary | ICD-10-CM | POA: Diagnosis not present

## 2019-12-22 DIAGNOSIS — F17210 Nicotine dependence, cigarettes, uncomplicated: Secondary | ICD-10-CM | POA: Diagnosis not present

## 2019-12-22 MED ORDER — HYDROCODONE-ACETAMINOPHEN 5-325 MG PO TABS: 1.0000 | ORAL_TABLET | Freq: Four times a day (QID) | ORAL | 0 refills | Status: AC | PRN

## 2019-12-22 MED ORDER — CYCLOBENZAPRINE HCL 10 MG PO TABS: 10.0000 mg | ORAL_TABLET | Freq: Three times a day (TID) | ORAL | 0 refills | Status: DC | PRN

## 2019-12-22 MED ORDER — PREDNISONE 10 MG (21) PO TBPK: ORAL_TABLET | ORAL | 0 refills | Status: DC

## 2019-12-22 NOTE — ED Triage Notes (Signed)
Pt ambulatory to triage.  Pt has lower back pain.  No known injury.  Pt has chronic back pain.  Denies urinary sx  Pt alert  Speech clear.

## 2019-12-22 NOTE — Discharge Instructions (Signed)
Please follow-up with either your primary care provider or Dr. Posey Pronto for symptoms that are not improving with medication.  Return to the emergency department for symptoms that change or worsen if you are unable to schedule an appointment.

## 2019-12-22 NOTE — ED Provider Notes (Signed)
[facility] Emergency Department Provider Note ____________________________________________  Time seen: Approximately 3:43 PM  I have reviewed the triage vital signs and the nursing notes.  HISTORY  Chief Complaint Back Pain   HPI [name] is a 64 y.o. female with a history of chronic back pain presenting to the emergency department for acute on chronic pain. No recent injury. Left side lumbar pain started 2 days ago with radiation into the left leg. No relief with tylenol. Pain is similar to previous sciatica.   Past Medical History:  Diagnosis Date  . Diverticulitis   . Environmental allergies   . GERD (gastroesophageal reflux disease)     Patient Active Problem List   Diagnosis Date Noted  . KNEE PAIN, RIGHT 10/10/2010  . CHANGE IN BOWELS 01/04/2010  . ABDOMINAL PAIN, UPPER 01/04/2010    Past Surgical History:  Procedure Laterality Date  . ABDOMINAL HYSTERECTOMY    . BLADDER SURGERY    . CHOLECYSTECTOMY    . FRACTURE SURGERY Left    ankle surgery  . JOINT REPLACEMENT    . TONSILLECTOMY      Prior to Admission medications   Medication Sig Start Date End Date Taking? Authorizing Provider  acetaminophen (TYLENOL) 500 MG tablet Take 1,000 mg by mouth 3 (three) times daily as needed. For pain    [provider]  amitriptyline (ELAVIL) 150 MG tablet Take 150 mg by mouth Nightly. 12/12/18   [provider]  Cholecalciferol (VITAMIN D) 50 MCG (2000 UT) tablet Take 2,000 Units by mouth daily.    [provider]  citalopram (CELEXA) 40 MG tablet Take 40 mg by mouth daily.      [provider]  clonazePAM (KLONOPIN) 0.5 MG tablet Take 0.5 mg by mouth 2 (two) times daily.     [provider]  cyclobenzaprine (FLEXERIL) 10 MG tablet Take 1 tablet (10 mg total) by mouth 3 (three) times daily as needed. 12/22/19   Janay Canan B, FNP  diazepam (VALIUM) 5 MG tablet Take 1 tablet (5 mg total) by mouth every 8  (eight) hours as needed for muscle spasms. 01/17/14   Ignacia Felling, PA-C  escitalopram (LEXAPRO) 20 MG tablet Take 30 mg by mouth daily. 06/11/17   [provider]  fluticasone (FLONASE) 50 MCG/ACT nasal spray Place 1 spray into the nose 3 (three) times daily. Each nostril. - Do not use this medication for more than 48 hours.  Please discard the unused portion. 09/08/11   Carmin Muskrat, MD  gabapentin (NEURONTIN) 300 MG capsule Take 900 mg by mouth at bedtime. 02/20/12   [provider]  HYDROcodone-acetaminophen (NORCO/VICODIN) 5-325 MG tablet Take 1 tablet by mouth every 6 (six) hours as needed for up to 3 days for severe pain. 12/22/19 12/25/19  Joannah Gitlin, Dessa Phi, FNP  loratadine (CLARITIN) 10 MG tablet Take 1 tablet (10 mg total) by mouth daily. 06/07/12 06/07/13  Kingsley Spittle, MD  magic mouthwash SOLN Take 30 mLs by mouth 4 (four) times daily. 11/11/18   [provider]  montelukast (SINGULAIR) 10 MG tablet Take 10 mg by mouth Nightly. 06/11/17   [provider]  omeprazole (PRILOSEC) 40 MG capsule Take 40 mg by mouth at bedtime.     [provider]  pantoprazole (PROTONIX) 40 MG tablet Take 40 mg by mouth 2 (two) times daily. 06/11/17   [provider]  polyethylene glycol powder (GLYCOLAX/MIRALAX) powder Mix 1 capful in drink and take by mouth 1 to 3  times daily as needed for soft stools OTC 06/18/18   Little, Wenda Overland, MD  predniSONE (STERAPRED UNI-PAK 21 TAB) 10 MG (21) TBPK tablet Take 6 tablets on the first day and decrease by 1 tablet each day until finished. 12/22/19   Hollie Bartus B, FNP  simvastatin (ZOCOR) 20 MG tablet Take 20 mg by mouth at bedtime.      [provider]  zolpidem (AMBIEN CR) 12.5 MG CR tablet Take 12.5 mg by mouth at bedtime as needed. For sleep    [provider]    Allergies Penicillins, Reglan [metoclopramide], Sulfonamide derivatives, and Meperidine hcl  No family history on file.  Social  History Social History   Tobacco Use  . Smoking status: Current Every Day Smoker    Packs/day: 0.50  . Smokeless tobacco: Never Used  Substance Use Topics  . Alcohol use: No  . Drug use: No    Review of Systems Constitutional: Well appearing. Respiratory: Negative for dyspnea. Cardiovascular: Negative for change in skin temperature or color. Musculoskeletal:   Negative for chronic steroid use   Negative for trauma in the presence of osteoporosis  Negative for age over 78 and trauma.  Negative for constitutional symptoms, or history of cancer  Negative for pain worse at night. Skin: Negative for rash, lesion, or wound.  Genitourinary: Negative for urinary retention. Rectal: Negative for fecal incontinence or new onset constipation/bowel habit changes. Hematological/Immunilogical: Negative for immunosuppression, IV drug use, or fever Neurological: Positive for burning, tingling, numb, electric, radiating pain in the right lower extremity.                        Negative for saddle anesthesia.                        Negative for focal neurologic deficit, progressive or disabling symptoms             Negative for saddle anesthesia. ____________________________________________   PHYSICAL EXAM:  VITAL SIGNS: ED Triage Vitals  Enc Vitals Group     BP 12/22/19 1519 (!) 127/48     Pulse Rate 12/22/19 1518 96     Resp 12/22/19 1518 20     Temp 12/22/19 1518 99.3 F (37.4 C)     Temp Source 12/22/19 1518 Oral     SpO2 12/22/19 1518 96 %     Weight 12/22/19 1520 153 lb (69.4 kg)     Height 12/22/19 1520 5\' 3"  (1.6 m)     Head Circumference --      Peak Flow --      Pain Score 12/22/19 1519 10     Pain Loc --      Pain Edu? --      Excl. in [place]? --     Constitutional: Alert and oriented. Well appearing and in no acute distress. Eyes: Conjunctivae are clear without discharge or drainage.  Head: Atraumatic. Neck: Full, active range of motion. Respiratory: Respirations even  and unlabored. Musculoskeletal: Full ROM of the back and extremities, Strength 5/5 of the lower extremities as tested. Neurologic: Reflexes of the lower extremities are 2+. Negative straight leg raise on the right side. Skin: Atraumatic.  Psychiatric: Behavior and affect are normal.  ____________________________________________   LABS (all labs ordered are listed, but only abnormal results are displayed)  Labs Reviewed - No data to display ____________________________________________  RADIOLOGY  Not indicated. ____________________________________________   PROCEDURES  Procedure(s) performed:  Procedures ____________________________________________   INITIAL IMPRESSION / ASSESSMENT AND PLAN / ED COURSE  [name] is a 64 y.o. female presents to the emergency department for acute on chronic back pain that started 2 days ago.  Patient reports that she has had radiofrequency treatments as recently as November 27, 2019 with improvement until 2 days ago.  Pain is in the same location as before.  She states that her physician is out of town this week.  She missed work today and decided to come to the emergency department.  She will be given a work excuse for today and tomorrow and be placed on Flexeril, prednisone, and a very short supply of Norco.  She is to see her primary care provider or return to the ER for symptoms of change or worsen.  Medications - No data to display  ED Discharge Orders         Ordered    cyclobenzaprine (FLEXERIL) 10 MG tablet  3 times daily PRN     12/22/19 1602    HYDROcodone-acetaminophen (NORCO/VICODIN) 5-325 MG tablet  Every 6 hours PRN     12/22/19 1602    predniSONE (STERAPRED UNI-PAK 21 TAB) 10 MG (21) TBPK tablet     12/22/19 1602           Pertinent labs & imaging results that were available during my care of the patient were reviewed by me and considered in my medical decision making (see chart for  details).  _________________________________________   FINAL CLINICAL IMPRESSION(S) / ED DIAGNOSES  Final diagnoses:  Sciatica of right side     If controlled substance prescribed during this visit, 12 month history viewed on the [place] prior to issuing an initial prescription for Schedule II or III opiod.   Victorino Dike, FNP 12/22/19 1610    Vanessa Byrnedale, MD 12/22/19 [phone]

## 2019-12-22 NOTE — ED Notes (Signed)
See triage note  Presents with lower back pain  Hx of chronic back pain  Denies any recent injury

## 2019-12-24 DIAGNOSIS — Z20828 Contact with and (suspected) exposure to other viral communicable diseases: Secondary | ICD-10-CM | POA: Diagnosis not present

## 2019-12-26 DIAGNOSIS — M5441 Lumbago with sciatica, right side: Secondary | ICD-10-CM | POA: Diagnosis not present

## 2019-12-26 DIAGNOSIS — G8929 Other chronic pain: Secondary | ICD-10-CM | POA: Diagnosis not present

## 2019-12-26 DIAGNOSIS — M47816 Spondylosis without myelopathy or radiculopathy, lumbar region: Secondary | ICD-10-CM | POA: Diagnosis not present

## 2019-12-26 DIAGNOSIS — T148XXA Other injury of unspecified body region, initial encounter: Secondary | ICD-10-CM | POA: Diagnosis not present

## 2020-01-15 DIAGNOSIS — G894 Chronic pain syndrome: Secondary | ICD-10-CM | POA: Diagnosis not present

## 2020-01-15 DIAGNOSIS — M47816 Spondylosis without myelopathy or radiculopathy, lumbar region: Secondary | ICD-10-CM | POA: Diagnosis not present

## 2020-01-15 DIAGNOSIS — M533 Sacrococcygeal disorders, not elsewhere classified: Secondary | ICD-10-CM

## 2020-01-15 HISTORY — DX: Sacrococcygeal disorders, not elsewhere classified: M53.3

## 2020-01-29 DIAGNOSIS — M533 Sacrococcygeal disorders, not elsewhere classified: Secondary | ICD-10-CM | POA: Diagnosis not present

## 2020-02-11 DIAGNOSIS — F411 Generalized anxiety disorder: Secondary | ICD-10-CM | POA: Diagnosis not present

## 2020-02-11 DIAGNOSIS — R69 Illness, unspecified: Secondary | ICD-10-CM | POA: Diagnosis not present

## 2020-02-11 DIAGNOSIS — Z760 Encounter for issue of repeat prescription: Secondary | ICD-10-CM | POA: Diagnosis not present

## 2020-04-17 ENCOUNTER — Ambulatory Visit: Payer: No Typology Code available for payment source | Attending: Internal Medicine

## 2020-04-17 DIAGNOSIS — Z23 Encounter for immunization: Secondary | ICD-10-CM

## 2020-04-17 NOTE — Progress Notes (Signed)
° °  Covid-19 Vaccination Clinic  Name:  [name]    MRN: [id number] DOB: [birth year]  04/17/2020  Ms. Balch was observed post Covid-19 immunization for 15 minutes without incident. She was provided with Vaccine Information Sheet and instruction to access the V-Safe system.   Ms. Remillard was instructed to call 911 with any severe reactions post vaccine:  Difficulty breathing   Swelling of face and throat   A fast heartbeat   A bad rash all over body   Dizziness and weakness   Immunizations Administered    Name Date Dose VIS Date Route   JANSSEN COVID-19 VACCINE 04/17/2020  9:14 AM 0.5 mL 01/03/2020 Intramuscular   Manufacturer: [employer]   Lot: [id number]   [place]: [id number]

## 2020-05-16 DIAGNOSIS — M5441 Lumbago with sciatica, right side: Secondary | ICD-10-CM | POA: Diagnosis not present

## 2020-05-18 DIAGNOSIS — K219 Gastro-esophageal reflux disease without esophagitis: Secondary | ICD-10-CM | POA: Diagnosis not present

## 2020-05-18 DIAGNOSIS — F5101 Primary insomnia: Secondary | ICD-10-CM | POA: Diagnosis not present

## 2020-05-18 DIAGNOSIS — Z79899 Other long term (current) drug therapy: Secondary | ICD-10-CM | POA: Diagnosis not present

## 2020-05-18 DIAGNOSIS — F411 Generalized anxiety disorder: Secondary | ICD-10-CM | POA: Diagnosis not present

## 2020-05-18 DIAGNOSIS — R69 Illness, unspecified: Secondary | ICD-10-CM | POA: Diagnosis not present

## 2020-06-06 DIAGNOSIS — M5441 Lumbago with sciatica, right side: Secondary | ICD-10-CM | POA: Diagnosis not present

## 2020-06-15 DIAGNOSIS — K3184 Gastroparesis: Secondary | ICD-10-CM | POA: Diagnosis not present

## 2020-06-15 DIAGNOSIS — R109 Unspecified abdominal pain: Secondary | ICD-10-CM | POA: Diagnosis not present

## 2020-06-15 DIAGNOSIS — R140 Abdominal distension (gaseous): Secondary | ICD-10-CM | POA: Diagnosis not present

## 2020-06-15 DIAGNOSIS — E1143 Type 2 diabetes mellitus with diabetic autonomic (poly)neuropathy: Secondary | ICD-10-CM | POA: Diagnosis not present

## 2020-06-15 DIAGNOSIS — K5900 Constipation, unspecified: Secondary | ICD-10-CM | POA: Diagnosis not present

## 2020-06-15 DIAGNOSIS — K5901 Slow transit constipation: Secondary | ICD-10-CM | POA: Diagnosis not present

## 2020-06-15 DIAGNOSIS — K219 Gastro-esophageal reflux disease without esophagitis: Secondary | ICD-10-CM | POA: Diagnosis not present

## 2020-07-07 DIAGNOSIS — R69 Illness, unspecified: Secondary | ICD-10-CM | POA: Diagnosis not present

## 2020-07-07 DIAGNOSIS — E559 Vitamin D deficiency, unspecified: Secondary | ICD-10-CM | POA: Diagnosis not present

## 2020-07-07 DIAGNOSIS — D649 Anemia, unspecified: Secondary | ICD-10-CM | POA: Diagnosis not present

## 2020-07-07 DIAGNOSIS — Z760 Encounter for issue of repeat prescription: Secondary | ICD-10-CM | POA: Diagnosis not present

## 2020-08-01 DIAGNOSIS — Z0100 Encounter for examination of eyes and vision without abnormal findings: Secondary | ICD-10-CM | POA: Diagnosis not present

## 2020-08-01 DIAGNOSIS — H524 Presbyopia: Secondary | ICD-10-CM | POA: Diagnosis not present

## 2020-11-13 DIAGNOSIS — R350 Frequency of micturition: Secondary | ICD-10-CM | POA: Diagnosis not present

## 2020-11-13 DIAGNOSIS — R109 Unspecified abdominal pain: Secondary | ICD-10-CM | POA: Diagnosis not present

## 2020-11-13 DIAGNOSIS — R8290 Unspecified abnormal findings in urine: Secondary | ICD-10-CM | POA: Diagnosis not present

## 2020-11-13 DIAGNOSIS — K5900 Constipation, unspecified: Secondary | ICD-10-CM | POA: Diagnosis not present

## 2020-11-13 DIAGNOSIS — M545 Low back pain, unspecified: Secondary | ICD-10-CM | POA: Diagnosis not present

## 2020-11-24 DIAGNOSIS — K581 Irritable bowel syndrome with constipation: Secondary | ICD-10-CM | POA: Diagnosis not present

## 2020-11-24 DIAGNOSIS — E559 Vitamin D deficiency, unspecified: Secondary | ICD-10-CM | POA: Diagnosis not present

## 2020-11-24 DIAGNOSIS — E782 Mixed hyperlipidemia: Secondary | ICD-10-CM | POA: Diagnosis not present

## 2020-11-24 DIAGNOSIS — R69 Illness, unspecified: Secondary | ICD-10-CM | POA: Diagnosis not present

## 2020-11-24 DIAGNOSIS — K219 Gastro-esophageal reflux disease without esophagitis: Secondary | ICD-10-CM | POA: Diagnosis not present

## 2020-11-24 DIAGNOSIS — D649 Anemia, unspecified: Secondary | ICD-10-CM | POA: Diagnosis not present

## 2021-02-15 DIAGNOSIS — F5101 Primary insomnia: Secondary | ICD-10-CM | POA: Diagnosis not present

## 2021-02-15 DIAGNOSIS — F419 Anxiety disorder, unspecified: Secondary | ICD-10-CM | POA: Diagnosis not present

## 2021-02-15 DIAGNOSIS — R69 Illness, unspecified: Secondary | ICD-10-CM | POA: Diagnosis not present

## 2021-05-04 DIAGNOSIS — K219 Gastro-esophageal reflux disease without esophagitis: Secondary | ICD-10-CM | POA: Diagnosis not present

## 2021-05-04 DIAGNOSIS — R69 Illness, unspecified: Secondary | ICD-10-CM | POA: Diagnosis not present

## 2021-05-04 DIAGNOSIS — K5909 Other constipation: Secondary | ICD-10-CM | POA: Diagnosis not present

## 2021-05-04 DIAGNOSIS — E785 Hyperlipidemia, unspecified: Secondary | ICD-10-CM | POA: Diagnosis not present

## 2021-06-07 DIAGNOSIS — D696 Thrombocytopenia, unspecified: Secondary | ICD-10-CM | POA: Diagnosis not present

## 2021-06-07 DIAGNOSIS — Z1211 Encounter for screening for malignant neoplasm of colon: Secondary | ICD-10-CM | POA: Diagnosis not present

## 2021-06-07 DIAGNOSIS — Z1212 Encounter for screening for malignant neoplasm of rectum: Secondary | ICD-10-CM | POA: Diagnosis not present

## 2021-06-07 DIAGNOSIS — Z1231 Encounter for screening mammogram for malignant neoplasm of breast: Secondary | ICD-10-CM | POA: Diagnosis not present

## 2021-07-05 DIAGNOSIS — B351 Tinea unguium: Secondary | ICD-10-CM | POA: Diagnosis not present

## 2021-07-05 DIAGNOSIS — R69 Illness, unspecified: Secondary | ICD-10-CM | POA: Diagnosis not present

## 2021-07-05 DIAGNOSIS — Z6823 Body mass index (BMI) 23.0-23.9, adult: Secondary | ICD-10-CM | POA: Diagnosis not present

## 2021-07-05 DIAGNOSIS — F339 Major depressive disorder, recurrent, unspecified: Secondary | ICD-10-CM | POA: Diagnosis not present

## 2021-08-09 DIAGNOSIS — R69 Illness, unspecified: Secondary | ICD-10-CM | POA: Diagnosis not present

## 2021-08-09 DIAGNOSIS — B351 Tinea unguium: Secondary | ICD-10-CM | POA: Diagnosis not present

## 2021-08-09 DIAGNOSIS — D696 Thrombocytopenia, unspecified: Secondary | ICD-10-CM | POA: Diagnosis not present

## 2021-08-09 DIAGNOSIS — F339 Major depressive disorder, recurrent, unspecified: Secondary | ICD-10-CM | POA: Diagnosis not present

## 2021-08-09 DIAGNOSIS — R5383 Other fatigue: Secondary | ICD-10-CM | POA: Diagnosis not present

## 2021-08-22 DIAGNOSIS — Z1231 Encounter for screening mammogram for malignant neoplasm of breast: Secondary | ICD-10-CM | POA: Diagnosis not present

## 2021-09-09 DIAGNOSIS — Z6821 Body mass index (BMI) 21.0-21.9, adult: Secondary | ICD-10-CM | POA: Diagnosis not present

## 2021-09-09 DIAGNOSIS — M545 Low back pain, unspecified: Secondary | ICD-10-CM | POA: Diagnosis not present

## 2021-09-09 DIAGNOSIS — C44519 Basal cell carcinoma of skin of other part of trunk: Secondary | ICD-10-CM | POA: Diagnosis not present

## 2021-09-15 ENCOUNTER — Other Ambulatory Visit: Payer: Self-pay

## 2021-09-15 ENCOUNTER — Ambulatory Visit (INDEPENDENT_AMBULATORY_CARE_PROVIDER_SITE_OTHER): Payer: 59 | Admitting: Dermatology

## 2021-09-15 DIAGNOSIS — L578 Other skin changes due to chronic exposure to nonionizing radiation: Secondary | ICD-10-CM

## 2021-09-15 DIAGNOSIS — C4492 Squamous cell carcinoma of skin, unspecified: Secondary | ICD-10-CM

## 2021-09-15 DIAGNOSIS — C510 Malignant neoplasm of labium majus: Secondary | ICD-10-CM

## 2021-09-15 DIAGNOSIS — D492 Neoplasm of unspecified behavior of bone, soft tissue, and skin: Secondary | ICD-10-CM

## 2021-09-15 HISTORY — DX: Squamous cell carcinoma of skin, unspecified: C44.92

## 2021-09-15 NOTE — Patient Instructions (Addendum)
Wound Care Instructions  Cleanse wound gently with soap and water once a day then pat dry with clean gauze. Apply a thing coat of Petrolatum (petroleum jelly, "Vaseline") over the wound (unless you have an allergy to this). We recommend that you use a new, sterile tube of Vaseline. Do not pick or remove scabs. Do not remove the yellow or white "healing tissue" from the base of the wound.  Cover the wound with fresh, clean, nonstick gauze and secure with paper tape. You may use Band-Aids in place of gauze and tape if the would is small enough, but would recommend trimming much of the tape off as there is often too much. Sometimes Band-Aids can irritate the skin.  You should call the office for your biopsy report after 1 week if you have not already been contacted.  If you experience any problems, such as abnormal amounts of bleeding, swelling, significant bruising, significant pain, or evidence of infection, please call the office immediately.  FOR ADULT SURGERY PATIENTS: If you need something for pain relief you may take 1 extra strength Tylenol (acetaminophen) AND 2 Ibuprofen (200mg each) together every 4 hours as needed for pain. (do not take these if you are allergic to them or if you have a reason you should not take them.) Typically, you may only need pain medication for 1 to 3 days.   If you have any questions or concerns for your doctor, please call our main line at [phone] and press option 4 to reach your doctor's medical assistant. If no one answers, please leave a voicemail as directed and we will return your call as soon as possible. Messages left after 4 pm will be answered the following business day.   You may also send us a message via MyChart. We typically respond to MyChart messages within 1-2 business days.  For prescription refills, please ask your pharmacy to contact our office. Our fax number is [phone].  If you have an urgent issue when the clinic is closed that  cannot wait until the next business day, you can page your doctor at the number below.    Please note that while we do our best to be available for urgent issues outside of office hours, we are not available 24/7.   If you have an urgent issue and are unable to reach us, you may choose to seek medical care at your doctor's office, retail clinic, urgent care center, or emergency room.  If you have a medical emergency, please immediately call 911 or go to the emergency department.  Pager Numbers  - Dr. Kowalski: [phone]  - Dr. Moye: [phone]  - Dr. Stewart: [phone]  In the event of inclement weather, please call our main line at [phone] for an update on the status of any delays or closures.  Dermatology Medication Tips: Please keep the boxes that topical medications come in in order to help keep track of the instructions about where and how to use these. Pharmacies typically print the medication instructions only on the boxes and not directly on the medication tubes.   If your medication is too expensive, please contact our office at [phone] option 4 or send us a message through MyChart.   We are unable to tell what your co-pay for medications will be in advance as this is different depending on your insurance coverage. However, we may be able to find a substitute medication at lower cost or fill out paperwork to get insurance to cover a needed   medication.   If a prior authorization is required to get your medication covered by your insurance company, please allow us 1-2 business days to complete this process.  Drug prices often vary depending on where the prescription is filled and some pharmacies may offer cheaper prices.  The website www.goodrx.com contains coupons for medications through different pharmacies. The prices here do not account for what the cost may be with help from insurance (it may be cheaper with your insurance), but the website can give you the  price if you did not use any insurance.  - You can print the associated coupon and take it with your prescription to the pharmacy.  - You may also stop by our office during regular business hours and pick up a GoodRx coupon card.  - If you need your prescription sent electronically to a different pharmacy, notify our office through [place] MyChart or by phone at [phone] option 4.   

## 2021-09-15 NOTE — Progress Notes (Signed)
   New Patient Visit  Subjective  [name] is a 65 y.o. female who presents for the following: Skin Problem (Pt here to have a bump in the vaginal area checked today, pt unknown how long this area been there ).  She has history of significant sun exposure and has other areas to be evaluated.  The following portions of the chart were reviewed this encounter and updated as appropriate:   Tobacco  Allergies  Meds  Problems  Med Hx  Surg Hx  Fam Hx     Review of Systems:  No other skin or systemic complaints except as noted in HPI or Assessment and Plan.  Objective  Well appearing patient in no apparent distress; mood and affect are within normal limits.  A focused examination was performed including groin,vagina. Relevant physical exam findings are noted in the Assessment and Plan.  left labia majora 0.7 cm cystic like papule    Assessment & Plan  Neoplasm of skin left labia majora /genital  Skin excision  Total excision diameter (cm):  0.7 Informed consent: discussed and consent obtained   Timeout: patient name, date of birth, surgical site, and procedure verified   Procedure prep:  Patient was prepped and draped in usual sterile fashion Prep type:  Chlorhexidine Anesthesia: the lesion was anesthetized in a standard fashion   Anesthetic:  1% lidocaine w/ epinephrine 1-100,000 buffered w/ 8.4% NaHCO3 Hemostasis achieved with: pressure and electrodesiccation    Specimen 1 - Surgical pathology Differential Diagnosis: R/O Cyst vs other   Check Margins: No  Actinic Damage - chronic, secondary to cumulative UV radiation exposure/sun exposure over time - diffuse scaly erythematous macules with underlying dyspigmentation - Recommend daily broad spectrum sunscreen SPF 30+ to sun-exposed areas, reapply every 2 hours as needed.  - Recommend staying in the shade or wearing long sleeves, sun glasses (UVA+UVB protection) and wide brim hats (4-inch brim around the entire  circumference of the hat). - Call for new or changing lesions.  Return if symptoms worsen or fail to improve.  IMarye Round, CMA, am acting as scribe for Sarina Ser, MD .  Documentation: I have reviewed the above documentation for accuracy and completeness, and I agree with the above.  Sarina Ser, MD

## 2021-09-20 ENCOUNTER — Encounter: Payer: Self-pay | Admitting: Dermatology

## 2021-09-23 ENCOUNTER — Telehealth: Payer: Self-pay

## 2021-09-23 NOTE — Telephone Encounter (Signed)
Discussed pathology results with patient appointment scheduled for 09/26/21 to discuss treatment options.

## 2021-09-23 NOTE — Telephone Encounter (Signed)
-----   Message from Ralene Bathe, MD sent at 09/20/2021  6:11 PM EST ----- Diagnosis Skin , left labia majora SQUAMOUS CELL CARCINOMA, KERATINIZING/WELL DIFFERENTIATED TYPE, AND LICHEN SCLEROSUS, SEE DESCRIPTION.  Cancer = SCC associated with Lichen Sclerosus Schedule with Gynecologic Oncologic Surgeon. Make pt appt with me if she wants to discuss further. May have to send to [facility]

## 2021-09-25 ENCOUNTER — Encounter: Payer: Self-pay | Admitting: Dermatology

## 2021-09-26 ENCOUNTER — Other Ambulatory Visit: Payer: Self-pay

## 2021-09-26 ENCOUNTER — Ambulatory Visit (INDEPENDENT_AMBULATORY_CARE_PROVIDER_SITE_OTHER): Payer: 59 | Admitting: Dermatology

## 2021-09-26 DIAGNOSIS — C510 Malignant neoplasm of labium majus: Secondary | ICD-10-CM

## 2021-09-26 DIAGNOSIS — C4492 Squamous cell carcinoma of skin, unspecified: Secondary | ICD-10-CM

## 2021-09-26 NOTE — Progress Notes (Signed)
   Follow-Up Visit   Subjective  ADEA GEISEL is a 65 y.o. female who presents for the following: Skin Cancer (Discuss pathology results ).  The following portions of the chart were reviewed this encounter and updated as appropriate:   Tobacco  Allergies  Meds  Problems  Med Hx  Surg Hx  Fam Hx     Review of Systems:  No other skin or systemic complaints except as noted in HPI or Assessment and Plan.  Objective  Well appearing patient in no apparent distress; mood and affect are within normal limits.  A focused examination was performed including face. Relevant physical exam findings are noted in the Assessment and Plan.  labia majora Keratotic pink papule/nodule or plaque.           Assessment & Plan  Squamous cell carcinoma of skin labia majora  Biopsy proven SQUAMOUS CELL CARCINOMA, KERATINIZING/WELL DIFFERENTIATED TYPE, AND LICHEN SCLEROSUS,    Excision site healing well.  Advised the patient this may need further treatment and we need to consult a gynecologic oncologic surgeon. Detailed discussion.  Cancer = SCC associated with Lichen Sclerosus Schedule with Gynecologic Oncologic Surgeon. May have to send to [facility]  Related Procedures Ambulatory referral to Gynecologic Oncology  Return in about 4 months (around 01/24/2022) for [facility] .  IMarye Round, CMA, am acting as scribe for Sarina Ser, MD .  Documentation: I have reviewed the above documentation for accuracy and completeness, and I agree with the above.  Sarina Ser, MD

## 2021-09-26 NOTE — Patient Instructions (Signed)

## 2021-09-28 ENCOUNTER — Telehealth: Payer: Self-pay | Admitting: *Deleted

## 2021-09-28 NOTE — Telephone Encounter (Signed)
Left the patient a message to call the office back to schedule a new patient appt with Dr Berline Lopes

## 2021-09-30 NOTE — Telephone Encounter (Signed)
Spoke with the patient and scheduled a new patient appt with Dr Berline Lopes on 12/1 at 9 am. Patient given the arrival time of 8:30 am. Patient given the address and phone number for the clinic,along with the policy for mask and visitors

## 2021-10-05 ENCOUNTER — Encounter: Payer: Self-pay | Admitting: Gynecologic Oncology

## 2021-10-06 NOTE — Progress Notes (Signed)
GYNECOLOGIC ONCOLOGY NEW PATIENT CONSULTATION   Patient Name: [name]  Patient Age: 65 y.o. Date of Service: 21/2/22 Referring Provider: Dr. Sarina Ser  Primary Care Provider: [place], [place] Consulting Provider: Jeral Pinch, MD   Assessment/Plan:  Postmenopausal patient with vulvar SCC arising in the setting of lichen sclerosus.   I discussed biopsy results with the patient as well as my exam. I do not see any other evidence of residual cancer. She does have findings consistent with lichen sclerosus including loss of architecture and thinning of vulvar tissue.   We reviewed the diagnosis of lichen sclerosus, prevalence among postmenopausal women, and the increased risk of development of vulvar carcinoma. We discussed prevention and early identification if she were to have recurrent carcinoma including use of topical steroid cream, reporting of new symptoms or lesions, and surveillance visits for exams. I sent in a prescription for clobetasol to her pharmacy today.  Based on the pathology report this appears to be a small lesion that was completely excised. I have a call into the pathologist to clarify depth of invasion, size of the lesion, and margin status. Margins appear to be negative and if lesion is minimally invasive (<66m), then the risk of nodal involvement is low enough that no nodal assessment is indicated. If this were the case, then we discussed moving forward with planning for a repeat excision with a partial modified radical vulvectomy.  Addendum: I spoke with the pathologist after the patient left the clinic. The DOI of the SCC is 1.740mand the size of the lesion is 4.7566mMargins are negative for SCC although close.  Given DOI, I called the patient to discuss change in recommendation that we discussed at her visit. Will plan to get a PET scan to evaluate for metastatic disease and recommend proceeding with surgery both for re-excision but also to evaluate nodal  metastases. I reviewed that this could be done either with full IGF lymphadenectomy or with a sentinel lymph node biopsy. We discussed risks and benefits of each with my recommendation to proceed with SLN biopsy although [facility] although understandably upset. I will have my office place a [facility] was sent to the patient's referring provider.   80 minutes of total time was spent for this patient encounter, including preparation, face-to-face counseling with the patient and coordination of care, and documentation of the encounter.   KatJeral PinchD  Division of Gynecologic Oncology  Department of Obstetrics and Gynecology  [facility]__________________________________________  Chief Complaint: Chief Complaint  Patient presents with   Malignant neoplasm of labia majora ([facility]  History of Present Illness:  [name] a 65 24o. y.o. female who is seen in consultation at the request of Dr. KowNehemiah Massedr an evaluation of squamous cell carcinoma of the vulva arising in lichen sclerosis.  Patient noted several weeks of vulvar pruritus which she describes as itching of her entire vulva.  This was the first time she had the symptoms.  About a week after her itching began, she noticed a spot on her left vulva.  She denies any other symptoms including vaginal bleeding, discharge, or pain. She was seen as a new patient for spot on her vulva. This was excised completely during her clinic visit. Pathology revealed small area of SCC in the background of lichen sclerosis.  She notes overall appetite is decreased since biopsy results came back secondary to being anxious.  She denies any nausea or emesis.  She denies any urinary symptoms.  She reports a history of IBS with more constipation symptoms.  Patient lives in [place].  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  She smokes less than a half a pack a day now and is trying to quit.  PAST MEDICAL HISTORY:  Past Medical History:  Diagnosis Date   Diverticulitis    Environmental allergies    GERD (gastroesophageal reflux disease)    IBS (irritable bowel syndrome)    Squamous cell carcinoma of skin 09/15/2021   L labia majora with lichen sclerosus - needs referral     PAST SURGICAL HISTORY:  Past Surgical History:  Procedure Laterality Date   ABDOMINAL HYSTERECTOMY     BLADDER SURGERY     CHOLECYSTECTOMY     FRACTURE SURGERY Left    ankle surgery   JOINT REPLACEMENT     TONSILLECTOMY      OB/GYN HISTORY:  OB History  Gravida Para Term Preterm AB Living  2 2          SAB IAB Ectopic Multiple Live Births               # Outcome Date GA Lbr Len/2nd Weight Sex Delivery Anes PTL Lv  2 Para           1 Para             No LMP recorded. Patient has had a hysterectomy.  Age at menarche: 51 Age at menopause: Reports total hysterectomy with ovaries removed at the age of 17 because of cysts on her ovaries.   Hx of HRT: Denies Hx of STDs: Denies Last pap: Not had any since her hysterectomy History of abnormal pap smears: Denies  SCREENING STUDIES:  Last mammogram: 2022  Last colonoscopy: 2011  MEDICATIONS: Outpatient Encounter Medications as of 10/07/2021  Medication Sig   acetaminophen (TYLENOL) 500 MG tablet Take 1,000 mg by mouth 3 (three) times daily as needed. For pain   amitriptyline (ELAVIL) 150 MG tablet Take 150 mg by mouth Nightly.   Cholecalciferol (VITAMIN D) 50 MCG (2000 UT) tablet Take 2,000 Units by mouth daily.   citalopram (CELEXA) 40 MG tablet Take 40 mg by mouth daily.     clobetasol ointment (TEMOVATE) 3.81 % Apply 1 application topically 3 (three) times a week. Use small amount on vulva 3 times a week at night   clonazePAM (KLONOPIN) 0.5 MG tablet Take 0.5 mg by mouth 2 (two) times daily.    escitalopram (LEXAPRO) 20 MG tablet Take 30 mg by mouth daily.    linaclotide (LINZESS) 290 MCG CAPS capsule Take 1 capsule by mouth daily.   montelukast (SINGULAIR) 10 MG tablet Take 10 mg by mouth Nightly.   pantoprazole (PROTONIX) 40 MG tablet Take 40 mg by mouth 2 (two) times daily.   polyethylene glycol powder (GLYCOLAX/MIRALAX) powder Mix 1 capful in drink and take by mouth 1 to 3 times daily as needed for soft stools OTC   QUEtiapine Fumarate (SEROQUEL XR) 150 MG 24 hr tablet Take by mouth.   sertraline (ZOLOFT) 100 MG tablet Take 1 tablet by mouth daily.   simvastatin (ZOCOR) 20 MG tablet Take 20 mg by mouth at bedtime.     terbinafine (LAMISIL) 250 MG tablet Take 250 mg by mouth daily.   tiZANidine (ZANAFLEX) 2 MG tablet Take 2  mg by mouth every 6 (six) hours as needed.   [DISCONTINUED] oxyCODONE (OXY IR/ROXICODONE) 5 MG immediate release tablet Take 1 tablet (5 mg total) by mouth every 4 (four) hours as needed for severe pain. For AFTER surgery, do not take and drive   cyclobenzaprine (FLEXERIL) 10 MG tablet Take 1 tablet (10 mg total) by mouth 3 (three) times daily as needed. (Patient not taking: Reported on 10/05/2021)   fluticasone (FLONASE) 50 MCG/ACT nasal spray Place 1 spray into the nose 3 (three) times daily. Each nostril. - Do not use this medication for more than 48 hours.  Please discard the unused portion. (Patient not taking: Reported on 10/05/2021)   gabapentin (NEURONTIN) 300 MG capsule Take 900 mg by mouth at bedtime. (Patient not taking: Reported on 10/05/2021)   magic mouthwash SOLN Take 30 mLs by mouth 4 (four) times daily. (Patient not taking: Reported on 10/05/2021)   zolpidem (AMBIEN CR) 12.5 MG CR tablet Take 12.5 mg by mouth at bedtime as needed. For sleep (Patient not taking: Reported on 10/05/2021)   [DISCONTINUED] loratadine (CLARITIN) 10 MG tablet Take 1 tablet (10 mg total) by mouth daily.   [DISCONTINUED] omeprazole (PRILOSEC) 40 MG capsule Take 40 mg by mouth at bedtime.  (Patient not taking: Reported on 10/05/2021)    [DISCONTINUED] predniSONE (STERAPRED UNI-PAK 21 TAB) 10 MG (21) TBPK tablet Take 6 tablets on the first day and decrease by 1 tablet each day until finished. (Patient not taking: Reported on 10/05/2021)   No facility-administered encounter medications on file as of 10/07/2021.    ALLERGIES:  Allergies  Allergen Reactions   Penicillins Nausea And Vomiting    Childhood allergy  Has patient had a PCN reaction causing immediate rash, facial/tongue/throat swelling, SOB or lightheadedness with hypotension: Yes Has patient had a PCN reaction causing severe rash involving mucus membranes or skin necrosis: Unknown Has patient had a PCN reaction that required hospitalization: Unknown Has patient had a PCN reaction occurring within the last 10 years: No If all of the above answers are "NO", then may proceed with Cephalosporin use.    Reglan [Metoclopramide] Other (See Comments)    Face becomes paralyzed.   Sulfonamide Derivatives Other (See Comments)    Pt has GERD and sulfa drugs aggravate condition   Meperidine Hcl Other (See Comments)    headache     FAMILY HISTORY:  Family History  Problem Relation Age of Onset   Lung cancer Maternal Aunt      SOCIAL HISTORY:  Social Connections: Not on file    REVIEW OF SYSTEMS:  Pertinent positives include constipation, ringing in ears, back pain, itching, anxiety, depression. Denies appetite changes, fevers, chills, fatigue, unexplained weight changes. Denies hearing loss, neck lumps or masses, mouth sores, or voice changes. Denies cough or wheezing.  Denies shortness of breath. Denies chest pain or palpitations. Denies leg swelling. Denies abdominal distention, pain, blood in stools, diarrhea, nausea, vomiting, or early satiety. Denies pain with intercourse, dysuria, frequency, hematuria or incontinence. Denies hot flashes, pelvic pain, vaginal bleeding or vaginal discharge.   Denies joint pain or muscle pain/cramps. Denies rash, or  wounds. Denies dizziness, headaches, numbness or seizures. Denies swollen lymph nodes or glands, denies easy bruising or bleeding. Denies confusion, or decreased concentration.  Physical Exam:  Vital Signs for this encounter:  Blood pressure (!) 130/57, pulse 91, temperature 97.9 F (36.6 C), temperature source Tympanic, resp. rate 18, height '5\' 3"'  (1.6 m), weight 113 lb 9.6 oz (51.5 kg), SpO2 100 %.  Body mass index is 20.12 kg/m. General: Alert, oriented, no acute distress.  HEENT: Normocephalic, atraumatic. Sclera anicteric.  Chest: Clear to auscultation bilaterally. No wheezes, rhonchi, or rales. Cardiovascular: Regular rate and rhythm, no murmurs, rubs, or gallops.  Abdomen: Normoactive bowel sounds. Soft, nondistended, nontender to palpation. No masses or hepatosplenomegaly appreciated. No palpable fluid wave.  Extremities: Grossly normal range of motion. Warm, well perfused. No edema bilaterally.  Skin: No rashes or lesions.  Lymphatics: No cervical, supraclavicular, or inguinal adenopathy.  GU: External female genitalia notable for about a 1 cm healing wound on the left labia majora, just superior and lateral to the urethra, approximately 2 cm from the midline.  No other lesions, masses, atypical vascularity, or hyperkeratosis noted.  There is loss of architecture of the posterior labia consistent with lichen sclerosis.  Moderate atrophy noted.  Speculum exam notable for moderate vaginal atrophy, no lesions or masses.  Cervix surgically absent.  Bimanual exam with intact cuff, no nodularity or masses.  LABORATORY AND RADIOLOGIC DATA:  Outside medical records were reviewed to synthesize the above history, along with the history and physical obtained during the visit.   Lab Results  Component Value Date   WBC 6.9 12/21/2018   HGB 11.2 (L) 12/21/2018   HCT 35.4 (L) 12/21/2018   PLT 258 12/21/2018   GLUCOSE 101 (H) 12/21/2018   ALT 118 (H) 06/17/2018   AST 60 (H) 06/17/2018   NA  136 12/21/2018   K 4.4 12/21/2018   CL 104 12/21/2018   CREATININE 0.79 12/21/2018   BUN 12 12/21/2018   CO2 27 12/21/2018   Vulvar biopsy on 11/10: Skin , left labia majora - 7 mm SQUAMOUS CELL CARCINOMA, KERATINIZING/WELL DIFFERENTIATED TYPE, AND LICHEN SCLEROSUS, SEE DESCRIPTION. Microscopic Description Notably in the periphery of this specimen, there are changes of lichen sclerosus (et atrophicus) with a band-like or hyalinized infiltrate subjacent to which are lymphocytes. However, in the central portion of the specimen the epidermis invaginates into well differentiated squamous cell carcinoma. This keratinizing type of squamous cell carcinoma is not associated with HPV and is usually seen in the setting of long standing lichen sclerosis or other inflammatory disorders. For this reason, p53 is performed which does highlight basaloid positivity in the epidermis and detached squamous nests. p16 is also performed which does exhibit rare positivity. This is a well differentiated keratinizing squamous cell carcinoma. This may behave more aggressively than HPV type carcinoma. Drs. Stahr and Saralyn Pilar (Surgical pathologist) are in agreement with the diagnosis of squamous cell carcinoma, well differentiated type associated with lichen sclerosis. (NDS:gt, 09/20/21)

## 2021-10-07 ENCOUNTER — Telehealth: Payer: Self-pay | Admitting: *Deleted

## 2021-10-07 ENCOUNTER — Telehealth: Payer: Self-pay

## 2021-10-07 ENCOUNTER — Telehealth: Payer: Self-pay | Admitting: Oncology

## 2021-10-07 ENCOUNTER — Inpatient Hospital Stay (HOSPITAL_BASED_OUTPATIENT_CLINIC_OR_DEPARTMENT_OTHER): Payer: 59 | Admitting: Gynecologic Oncology

## 2021-10-07 ENCOUNTER — Other Ambulatory Visit: Payer: Self-pay

## 2021-10-07 ENCOUNTER — Inpatient Hospital Stay: Payer: 59 | Attending: Gynecologic Oncology | Admitting: Gynecologic Oncology

## 2021-10-07 ENCOUNTER — Encounter: Payer: Self-pay | Admitting: Gynecologic Oncology

## 2021-10-07 VITALS — BP 130/57 | HR 91 | Temp 97.9°F | Resp 18 | Ht 63.0 in | Wt 113.6 lb

## 2021-10-07 DIAGNOSIS — K219 Gastro-esophageal reflux disease without esophagitis: Secondary | ICD-10-CM | POA: Diagnosis not present

## 2021-10-07 DIAGNOSIS — L900 Lichen sclerosus et atrophicus: Secondary | ICD-10-CM | POA: Diagnosis not present

## 2021-10-07 DIAGNOSIS — R69 Illness, unspecified: Secondary | ICD-10-CM | POA: Diagnosis not present

## 2021-10-07 DIAGNOSIS — F17210 Nicotine dependence, cigarettes, uncomplicated: Secondary | ICD-10-CM | POA: Insufficient documentation

## 2021-10-07 DIAGNOSIS — C519 Malignant neoplasm of vulva, unspecified: Secondary | ICD-10-CM

## 2021-10-07 DIAGNOSIS — Z90710 Acquired absence of both cervix and uterus: Secondary | ICD-10-CM | POA: Insufficient documentation

## 2021-10-07 DIAGNOSIS — C510 Malignant neoplasm of labium majus: Secondary | ICD-10-CM | POA: Diagnosis not present

## 2021-10-07 DIAGNOSIS — Z90722 Acquired absence of ovaries, bilateral: Secondary | ICD-10-CM | POA: Insufficient documentation

## 2021-10-07 DIAGNOSIS — K589 Irritable bowel syndrome without diarrhea: Secondary | ICD-10-CM | POA: Diagnosis not present

## 2021-10-07 DIAGNOSIS — Z79899 Other long term (current) drug therapy: Secondary | ICD-10-CM | POA: Insufficient documentation

## 2021-10-07 MED ORDER — CLOBETASOL PROPIONATE 0.05 % EX OINT: 1.0000 "application " | TOPICAL_OINTMENT | CUTANEOUS | 2 refills | Status: DC

## 2021-10-07 MED ORDER — OXYCODONE HCL 5 MG PO TABS: 5.0000 mg | ORAL_TABLET | ORAL | 0 refills | Status: DC | PRN

## 2021-10-07 NOTE — Progress Notes (Signed)
Patient here for new patient consultation with Dr. Jeral Pinch and for a pre-operative discussion prior to her scheduled surgery on October 18, 2021. She is scheduled for modified radical vulvectomy. The surgery was discussed in detail.  See after visit summary for additional details. Visual aids used to discuss items related to surgery including sequential compression stockings, IV pump, multi-modal pain regimen including tylenol.   Discussed post-op pain management in detail including the aspects of the enhanced recovery pathway.  Advised her that a new prescription would be sent in for oxycodone and it is only to be used for after her upcoming surgery.  We discussed the use of tylenol post-op and to monitor for a maximum of 4,000 mg in a 24 hour period. Discussed bowel regimen in detail. She will plan to use linzess at home and will contact the office if this is not working for her. She states she cannot take ibuprofen or NSAIDS due to GERD.     Discussed the use of SCDs and measures to take at home to prevent DVT including frequent mobility.  Reportable signs and symptoms of DVT discussed. Post-operative instructions discussed and expectations for after surgery. Incisional care discussed as well including reportable signs and symptoms including erythema, drainage, wound separation.     5 minutes spent with the patient.  Verbalizing understanding of material discussed. No needs or concerns voiced at the end of the visit.   Advised patient to call for any needs.  Advised that her post-operative medication had been prescribed and could be picked up at any time.    This appointment is included in the global surgical bundle as pre-operative teaching and has no charge.      Update: After patient's visit, Dr. Berline Lopes spoke with the pathologist about her recent biopsy. Based on the discussion/additional findings provided, it is recommended surgery be cancelled in [facility] and be rescheduled at [facility] in  order to have sentinel lymph node sampling. She is advised to proceed with a PET scan here in [place]. Her pharmacy will be contacted to cancel the prescription for oxycodone for post-op pain since she will receive this at [facility] from her surgeon there.

## 2021-10-07 NOTE — Telephone Encounter (Signed)
Spoke with [facility] pharmacist Dhwani and cancelled prescription for the oxycodone sent in today by Melissa Cross,NP. Patient will be having surgery at [facility]. Medication cancelled.

## 2021-10-07 NOTE — Telephone Encounter (Signed)
Called [facility] to clarify margin status and depth of invasion on accession [phone].  They will call back.

## 2021-10-07 NOTE — Patient Instructions (Signed)
Plan to begin using clobetasol to the vulva for itching three times a week.  Preparing for your Surgery  Plan for surgery on October 18, 2021 with Dr. Jeral Pinch at [facility]. You will be scheduled for a modified radical vulvectomy (removal on the area on your vulva and closing the incision with dissolvable stitches.   Pre-operative Testing -You will receive a phone call from presurgical testing at [facility] to discuss surgery instructions and arrange for lab work if needed.  -Bring your insurance card, copy of an advanced directive if applicable, medication list.  -You should not be taking blood thinners or aspirin at least ten days prior to surgery unless instructed by your surgeon.  -Do not take supplements such as fish oil (omega 3), red yeast rice, turmeric before your surgery. You want to avoid medications with aspirin in them including headache powders such as BC or Goody's), Excedrin migraine.  Day Before Surgery at [place] will be advised you can have clear liquids up until 3 hours before your surgery.    Your role in recovery Your role is to become active as soon as directed by your doctor, while still giving yourself time to heal.  Rest when you feel tired. You will be asked to do the following in order to speed your recovery:  - Cough and breathe deeply. This helps to clear and expand your lungs and can prevent pneumonia after surgery.  - [place]. Do mild physical activity. Walking or moving your legs help your circulation and body functions return to normal. Do not try to get up or walk alone the first time after surgery.   -If you develop swelling on one leg or the other, pain in the back of your leg, redness/warmth in one of your legs, please call the office or go to the Emergency Room to have a doppler to rule out a blood clot. For shortness of breath, chest pain-seek care in the Emergency Room as soon  as possible. - Actively manage your pain. Managing your pain lets you move in comfort. We will ask you to rate your pain on a scale of zero to 10. It is your responsibility to tell your doctor or nurse where and how much you hurt so your pain can be treated.  Special Considerations -Your final pathology results from surgery should be available around one week after surgery and the results will be relayed to you when available.  -FMLA forms can be faxed to [phone] and please allow 5-7 business days for completion.  Pain Management After Surgery -You have been prescribed your pain medication before surgery so that you can have this available when you are discharged from the hospital. The pain medication is for use ONLY AFTER surgery and a new prescription will not be given.   -Make sure that you have Tylenol and Ibuprofen if you are able to take this at home to use on a regular basis after surgery for pain control. We recommend alternating the medications every hour to six hours since they work differently and are processed in the body differently for pain relief.  -Review the attached handout on narcotic use and their risks and side effects.   Bowel Regimen -You can continue taking your linzess to prevent constipation. It is important to prevent constipation and drink adequate amounts of liquids.   Risks of Surgery Risks of surgery are low but include bleeding, infection, damage to surrounding  structures, re-operation, blood clots, and very rarely death.  AFTER SURGERY INSTRUCTIONS  Return to work:  2-4 weeks if applicable  Activity: 1. Be up and out of the bed during the day.  Take a nap if needed.  You may walk up steps but be careful and use the hand rail.  Stair climbing will tire you more than you think, you may need to stop part way and rest.   2. No lifting or straining for 2 weeks over 10 pounds. No pushing, pulling, straining for 2 weeks.  3. No driving for minimum 24 hours  after surgery.  Do not drive if you are taking narcotic pain medicine and make sure that your reaction time has returned.   4. You can shower as soon as the next day after surgery. Shower daily. No tub baths or submerging your body in water until cleared by your surgeon. If you have the soap that was given to you by pre-surgical testing that was used before surgery, you do not need to use it afterwards because this can irritate your incisions.   5. No sexual activity and nothing in the vagina for 4 weeks.  8. You may experience vaginal spotting and discharge after surgery.  The spotting is normal but if you experience heavy bleeding, call our office.  9. Take Tylenol or ibuprofen first for pain and only use narcotic pain medication for severe pain not relieved by the Tylenol or Ibuprofen.  Monitor your Tylenol intake to a max of 4,000 mg in a 24 hour period. You can alternate these medications after surgery.  Diet: 1. Low sodium Heart Healthy Diet is recommended but you are cleared to resume your normal (before surgery) diet after your procedure.  2. It is safe to use a laxative, such as Miralax or Colace, if you have difficulty moving your bowels. You have been prescribed Sennakot at bedtime every evening to keep bowel movements regular and to prevent constipation.    Wound Care: 1. Keep clean and dry.  Shower daily.  Reasons to call the Doctor: Fever - Oral temperature greater than 100.4 degrees Fahrenheit Foul-smelling vaginal discharge Difficulty urinating Nausea and vomiting Increased pain at the site of the incision that is unrelieved with pain medicine. Difficulty breathing with or without chest pain New calf pain especially if only on one side Sudden, continuing increased vaginal bleeding with or without clots.   Contacts: For questions or concerns you should contact:  Dr. Jeral Pinch at [phone]  Joylene John, NP at [phone]  After Hours: call [phone]  and have the GYN Oncologist paged/contacted (after 5 pm or on the weekends).  Messages sent via mychart are for non-urgent matters and are not responded to after hours so for urgent needs, please call the after hours number.

## 2021-10-07 NOTE — Patient Instructions (Signed)
Plan to begin using clobetasol to the vulva for itching three times a week.   Preparing for your Surgery   Plan for surgery on October 18, 2021 with Dr. Jeral Pinch at [facility]. You will be scheduled for a modified radical vulvectomy (removal on the area on your vulva and closing the incision with dissolvable stitches.    Pre-operative Testing -You will receive a phone call from presurgical testing at [facility] to discuss surgery instructions and arrange for lab work if needed.   -Bring your insurance card, copy of an advanced directive if applicable, medication list.   -You should not be taking blood thinners or aspirin at least ten days prior to surgery unless instructed by your surgeon.   -Do not take supplements such as fish oil (omega 3), red yeast rice, turmeric before your surgery. You want to avoid medications with aspirin in them including headache powders such as BC or Goody's), Excedrin migraine.   Day Before Surgery at [place] will be advised you can have clear liquids up until 3 hours before your surgery.     Your role in recovery Your role is to become active as soon as directed by your doctor, while still giving yourself time to heal.  Rest when you feel tired. You will be asked to do the following in order to speed your recovery:   - Cough and breathe deeply. This helps to clear and expand your lungs and can prevent pneumonia after surgery.  - [place]. Do mild physical activity. Walking or moving your legs help your circulation and body functions return to normal. Do not try to get up or walk alone the first time after surgery.   -If you develop swelling on one leg or the other, pain in the back of your leg, redness/warmth in one of your legs, please call the office or go to the Emergency Room to have a doppler to rule out a blood clot. For shortness of breath, chest pain-seek care in the Emergency Room  as soon as possible. - Actively manage your pain. Managing your pain lets you move in comfort. We will ask you to rate your pain on a scale of zero to 10. It is your responsibility to tell your doctor or nurse where and how much you hurt so your pain can be treated.   Special Considerations -Your final pathology results from surgery should be available around one week after surgery and the results will be relayed to you when available.   -FMLA forms can be faxed to [phone] and please allow 5-7 business days for completion.   Pain Management After Surgery -You have been prescribed your pain medication before surgery so that you can have this available when you are discharged from the hospital. The pain medication is for use ONLY AFTER surgery and a new prescription will not be given.    -Make sure that you have Tylenol and Ibuprofen if you are able to take this at home to use on a regular basis after surgery for pain control. We recommend alternating the medications every hour to six hours since they work differently and are processed in the body differently for pain relief.   -Review the attached handout on narcotic use and their risks and side effects.    Bowel Regimen -You can continue taking your linzess to prevent constipation. It is important to prevent constipation and drink adequate amounts of liquids.  Risks of Surgery Risks of surgery are low but include bleeding, infection, damage to surrounding structures, re-operation, blood clots, and very rarely death.   AFTER SURGERY INSTRUCTIONS   Return to work:  2-4 weeks if applicable   Activity: 1. Be up and out of the bed during the day.  Take a nap if needed.  You may walk up steps but be careful and use the hand rail.  Stair climbing will tire you more than you think, you may need to stop part way and rest.    2. No lifting or straining for 2 weeks over 10 pounds. No pushing, pulling, straining for 2 weeks.   3. No driving  for minimum 24 hours after surgery.  Do not drive if you are taking narcotic pain medicine and make sure that your reaction time has returned.    4. You can shower as soon as the next day after surgery. Shower daily. No tub baths or submerging your body in water until cleared by your surgeon. If you have the soap that was given to you by pre-surgical testing that was used before surgery, you do not need to use it afterwards because this can irritate your incisions.    5. No sexual activity and nothing in the vagina for 4 weeks.   8. You may experience vaginal spotting and discharge after surgery.  The spotting is normal but if you experience heavy bleeding, call our office.   9. Take Tylenol or ibuprofen first for pain and only use narcotic pain medication for severe pain not relieved by the Tylenol or Ibuprofen.  Monitor your Tylenol intake to a max of 4,000 mg in a 24 hour period. You can alternate these medications after surgery.   Diet: 1. Low sodium Heart Healthy Diet is recommended but you are cleared to resume your normal (before surgery) diet after your procedure.   2. It is safe to use a laxative, such as Miralax or Colace, if you have difficulty moving your bowels. You have been prescribed Sennakot at bedtime every evening to keep bowel movements regular and to prevent constipation.     Wound Care: 1. Keep clean and dry.  Shower daily.   Reasons to call the Doctor: Fever - Oral temperature greater than 100.4 degrees Fahrenheit Foul-smelling vaginal discharge Difficulty urinating Nausea and vomiting Increased pain at the site of the incision that is unrelieved with pain medicine. Difficulty breathing with or without chest pain New calf pain especially if only on one side Sudden, continuing increased vaginal bleeding with or without clots.   Contacts: For questions or concerns you should contact:   Dr. Jeral Pinch at [phone]   Joylene John, NP at [phone]    After Hours: call [phone] and have the GYN Oncologist paged/contacted (after 5 pm or on the weekends).   Messages sent via mychart are for non-urgent matters and are not responded to after hours so for urgent needs, please call the after hours number.

## 2021-10-07 NOTE — Telephone Encounter (Signed)
Per Dr Berline Lopes scheduled the patient for a PET scan on 12/12 at 57 am. Spoke with the patient and gave the date/time with instructions to the patient

## 2021-10-09 ENCOUNTER — Encounter: Payer: Self-pay | Admitting: Dermatology

## 2021-10-11 ENCOUNTER — Telehealth: Payer: Self-pay | Admitting: *Deleted

## 2021-10-11 NOTE — Telephone Encounter (Signed)
Per Dr Berline Lopes fax records to [facility] for Dr Dionne Milo for new patient/surgery

## 2021-10-12 ENCOUNTER — Telehealth: Payer: Self-pay | Admitting: *Deleted

## 2021-10-12 NOTE — Telephone Encounter (Signed)
Patient called this morning regarding her referral to [facility] and that she had not heard from them. Explained to the patient that the referral and records were fax to [facility] and the doctor is reviewing her records. Explained that if she have not heard from them by noon to call that office; patient given the number.  Patient also stated that "I have nervous for my scan and don't want to have to wait for the results." Explained that sometimes the results can take up to 24 hrs, but once the results were done and signed the report would release to both Dr Berline Lopes and her at the same time. Patient asked "what is the PET scan for. What area will it cover." Explained "that the PET scan is from her skull bass to her thigh. That the scan looks to see if the cancer has spread to other parts of the body."  Patient stated "now that scares me more." Explained to the patient that "it the scan helps to reasure the doctor and you that the cancer had not gone to other parts of the body and staying to the original site."   Per Sherri at [facility] patient was scheduled to see Dr Dionne Milo on 12/22 and was upset for the wait. explained that you also ask for Dr Ihor Austin, her first was 12/21. Sherri offered Dr Colonel Bald for 12/15.

## 2021-10-17 ENCOUNTER — Other Ambulatory Visit: Payer: Self-pay

## 2021-10-17 ENCOUNTER — Encounter (HOSPITAL_COMMUNITY)
Admission: RE | Admit: 2021-10-17 | Discharge: 2021-10-17 | Disposition: A | Discharge status: Home / Self Care | Payer: 59 | Source: Ambulatory Visit | Attending: Gynecologic Oncology | Admitting: Gynecologic Oncology

## 2021-10-17 DIAGNOSIS — C519 Malignant neoplasm of vulva, unspecified: Secondary | ICD-10-CM | POA: Diagnosis not present

## 2021-10-17 LAB — GLUCOSE, CAPILLARY: Glucose-Capillary: 107 mg/dL — ABNORMAL HIGH (ref 70–99)

## 2021-10-17 MED ORDER — FLUDEOXYGLUCOSE F - 18 (FDG) INJECTION
6.1000 | Freq: Once | INTRAVENOUS | Status: AC
  Administered 2021-10-17: 5.64 via INTRAVENOUS

## 2021-10-18 DIAGNOSIS — D485 Neoplasm of uncertain behavior of skin: Secondary | ICD-10-CM | POA: Diagnosis not present

## 2021-10-19 ENCOUNTER — Encounter: Payer: Self-pay | Admitting: Oncology

## 2021-10-19 DIAGNOSIS — E041 Nontoxic single thyroid nodule: Secondary | ICD-10-CM

## 2021-10-19 NOTE — Progress Notes (Signed)
US thyroid ordered per Dr. Berline Lopes from PET scan recommendations.

## 2021-10-20 ENCOUNTER — Telehealth: Payer: Self-pay | Admitting: *Deleted

## 2021-10-20 DIAGNOSIS — C519 Malignant neoplasm of vulva, unspecified: Secondary | ICD-10-CM | POA: Diagnosis not present

## 2021-10-20 HISTORY — DX: Malignant neoplasm of vulva, unspecified: C51.9

## 2021-10-20 NOTE — Telephone Encounter (Signed)
Per Dr Berline Lopes, scheduled the patient for an US thyroid. Attempted to reach the patient to given appt date/time; left message for the patient to call the office back

## 2021-10-21 NOTE — Telephone Encounter (Signed)
Called and left the patient a message with the [place] appt date/time

## 2021-10-25 DIAGNOSIS — C519 Malignant neoplasm of vulva, unspecified: Secondary | ICD-10-CM | POA: Diagnosis not present

## 2021-10-27 ENCOUNTER — Ambulatory Visit (HOSPITAL_COMMUNITY)
Admission: RE | Admit: 2021-10-27 | Discharge: 2021-10-27 | Disposition: A | Discharge status: Home / Self Care | Payer: 59 | Source: Ambulatory Visit | Attending: Gynecologic Oncology | Admitting: Gynecologic Oncology

## 2021-10-27 ENCOUNTER — Other Ambulatory Visit: Payer: Self-pay

## 2021-10-27 ENCOUNTER — Telehealth: Payer: Self-pay

## 2021-10-27 DIAGNOSIS — E041 Nontoxic single thyroid nodule: Secondary | ICD-10-CM | POA: Diagnosis not present

## 2021-10-27 NOTE — Telephone Encounter (Signed)
Spoke with [name] this afternoon regarding her [place] results of her thyroid. Pt informed that the radiologist did not feel the nodules needed to be biopsied or followed on imaging. The US showed enlargement of the thyroid. Patient verbalized understanding.  Results will be faxed to PCP.

## 2021-11-11 ENCOUNTER — Telehealth: Payer: Self-pay

## 2021-11-11 NOTE — Telephone Encounter (Signed)
Received call from Ms. Showman this morning. Patient confused why she has a post-op appointment scheduled for Monday 11/14/21 as she has not had surgery.  Appointment was originally scheduled when she was going to have surgery on 10/18/21. Surgery was cancelled but post-op appointment was not.  Appointment has been cancelled. Instructed to call with any needs.

## 2021-11-14 ENCOUNTER — Encounter: Payer: 59 | Admitting: Gynecologic Oncology

## 2021-11-15 ENCOUNTER — Other Ambulatory Visit: Payer: Self-pay

## 2021-11-15 ENCOUNTER — Emergency Department: Payer: 59

## 2021-11-15 ENCOUNTER — Emergency Department
Admission: EM | Admit: 2021-11-15 | Discharge: 2021-11-15 | Disposition: A | Discharge status: Home / Self Care | Payer: 59 | Attending: Emergency Medicine | Admitting: Emergency Medicine

## 2021-11-15 DIAGNOSIS — R0781 Pleurodynia: Secondary | ICD-10-CM | POA: Diagnosis not present

## 2021-11-15 DIAGNOSIS — N281 Cyst of kidney, acquired: Secondary | ICD-10-CM | POA: Diagnosis not present

## 2021-11-15 DIAGNOSIS — K429 Umbilical hernia without obstruction or gangrene: Secondary | ICD-10-CM | POA: Diagnosis not present

## 2021-11-15 DIAGNOSIS — S299XXA Unspecified injury of thorax, initial encounter: Secondary | ICD-10-CM | POA: Diagnosis present

## 2021-11-15 DIAGNOSIS — W1839XA Other fall on same level, initial encounter: Secondary | ICD-10-CM | POA: Diagnosis not present

## 2021-11-15 DIAGNOSIS — K760 Fatty (change of) liver, not elsewhere classified: Secondary | ICD-10-CM | POA: Diagnosis not present

## 2021-11-15 DIAGNOSIS — S20211A Contusion of right front wall of thorax, initial encounter: Secondary | ICD-10-CM

## 2021-11-15 DIAGNOSIS — I700 Atherosclerosis of aorta: Secondary | ICD-10-CM | POA: Diagnosis not present

## 2021-11-15 DIAGNOSIS — R109 Unspecified abdominal pain: Secondary | ICD-10-CM | POA: Insufficient documentation

## 2021-11-15 DIAGNOSIS — M47816 Spondylosis without myelopathy or radiculopathy, lumbar region: Secondary | ICD-10-CM | POA: Diagnosis not present

## 2021-11-15 DIAGNOSIS — Z9049 Acquired absence of other specified parts of digestive tract: Secondary | ICD-10-CM | POA: Diagnosis not present

## 2021-11-15 DIAGNOSIS — I2510 Atherosclerotic heart disease of native coronary artery without angina pectoris: Secondary | ICD-10-CM | POA: Diagnosis not present

## 2021-11-15 LAB — CBC WITH DIFFERENTIAL/PLATELET
Abs Immature Granulocytes: 0.02 10*3/uL (ref 0.00–0.07)
Basophils Absolute: 0.1 10*3/uL (ref 0.0–0.1)
Basophils Relative: 2 %
Eosinophils Absolute: 0.3 10*3/uL (ref 0.0–0.5)
Eosinophils Relative: 5 %
HCT: 39.2 % (ref 36.0–46.0)
Hemoglobin: 12.9 g/dL (ref 12.0–15.0)
Immature Granulocytes: 0 %
Lymphocytes Relative: 25 %
Lymphs Abs: 1.6 10*3/uL (ref 0.7–4.0)
MCH: 31.2 pg (ref 26.0–34.0)
MCHC: 32.9 g/dL (ref 30.0–36.0)
MCV: 94.7 fL (ref 80.0–100.0)
Monocytes Absolute: 0.8 10*3/uL (ref 0.1–1.0)
Monocytes Relative: 13 %
Neutro Abs: 3.5 10*3/uL (ref 1.7–7.7)
Neutrophils Relative %: 55 %
Platelets: 134 10*3/uL — ABNORMAL LOW (ref 150–400)
RBC: 4.14 MIL/uL (ref 3.87–5.11)
RDW: 13.6 % (ref 11.5–15.5)
Smear Review: NORMAL
WBC: 6.3 10*3/uL (ref 4.0–10.5)
nRBC: 0 % (ref 0.0–0.2)

## 2021-11-15 LAB — URINALYSIS, ROUTINE W REFLEX MICROSCOPIC
Bilirubin Urine: NEGATIVE
Glucose, UA: NEGATIVE mg/dL
Hgb urine dipstick: NEGATIVE
Ketones, ur: NEGATIVE mg/dL
Leukocytes,Ua: NEGATIVE
Nitrite: NEGATIVE
Protein, ur: NEGATIVE mg/dL
Specific Gravity, Urine: 1.003 — ABNORMAL LOW (ref 1.005–1.030)
pH: 6 (ref 5.0–8.0)

## 2021-11-15 LAB — COMPREHENSIVE METABOLIC PANEL
ALT: 6 U/L (ref 0–44)
AST: 13 U/L — ABNORMAL LOW (ref 15–41)
Albumin: 3.8 g/dL (ref 3.5–5.0)
Alkaline Phosphatase: 62 U/L (ref 38–126)
Anion gap: 9 (ref 5–15)
BUN: 17 mg/dL (ref 8–23)
CO2: 26 mmol/L (ref 22–32)
Calcium: 9.1 mg/dL (ref 8.9–10.3)
Chloride: 102 mmol/L (ref 98–111)
Creatinine, Ser: 0.79 mg/dL (ref 0.44–1.00)
GFR, Estimated: >60 mL/min (ref >60)
Glucose, Bld: 108 mg/dL — ABNORMAL HIGH (ref 70–99)
Potassium: 4.3 mmol/L (ref 3.5–5.1)
Sodium: 137 mmol/L (ref 135–145)
Total Bilirubin: 0.3 mg/dL (ref 0.3–1.2)
Total Protein: 7.4 g/dL (ref 6.5–8.1)

## 2021-11-15 MED ORDER — LIDOCAINE 5 % EX PTCH
1.0000 | MEDICATED_PATCH | CUTANEOUS | Status: DC
  Administered 2021-11-15: 1 via TRANSDERMAL
  Filled 2021-11-15: qty 1

## 2021-11-15 MED ORDER — TRAMADOL HCL 50 MG PO TABS: 50.0000 mg | ORAL_TABLET | Freq: Four times a day (QID) | ORAL | 0 refills | Status: DC | PRN

## 2021-11-15 MED ORDER — LIDOCAINE 5 % EX PTCH: 1.0000 | MEDICATED_PATCH | Freq: Two times a day (BID) | CUTANEOUS | 0 refills | Status: DC

## 2021-11-15 MED ORDER — IOHEXOL 350 MG/ML SOLN
80.0000 mL | Freq: Once | INTRAVENOUS | Status: AC | PRN
  Administered 2021-11-15: 80 mL via INTRAVENOUS
  Filled 2021-11-15: qty 80

## 2021-11-15 NOTE — Discharge Instructions (Signed)
Follow-up with your regular doctor if not improving in 5 to 7 days.  Return emergency department for worsening.  Apply ice to the right ribs.  Use medication as prescribed

## 2021-11-15 NOTE — ED Provider Notes (Signed)
[facility] Provider Note    Event Date/Time   First MD Initiated Contact with Patient 11/15/21 [phone]     (approximate)   History   Rib Injury   HPI  [name] is a 66 y.o. female presents emergency department complaining of a fall onto the right ribs on Sunday.  Patient fell against a metal shelf.  Pain to the right ribs and right upper quadrant.  No vomiting.  No blood in her urine.  No head injury.  Patient states it hurts to take a deep breath.      Physical Exam   Triage Vital Signs: ED Triage Vitals  Enc Vitals Group     BP 11/15/21 0730 118/65     Pulse Rate 11/15/21 0730 85     Resp 11/15/21 0730 17     Temp 11/15/21 0730 97.9 F (36.6 C)     Temp Source 11/15/21 0730 Oral     SpO2 11/15/21 0730 98 %     Weight 11/15/21 0731 110 lb (49.9 kg)     Height 11/15/21 0731 5\' 3"  (1.6 m)     Head Circumference --      Peak Flow --      Pain Score 11/15/21 0731 8     Pain Loc --      Pain Edu? --      Excl. in [place]? --     Most recent vital signs: Vitals:   11/15/21 0730  BP: 118/65  Pulse: 85  Resp: 17  Temp: 97.9 F (36.6 C)  SpO2: 98%     General: Awake, no distress.   CV:  Good peripheral perfusion.   Resp:  Normal effort.  CTAB Abd:  No distention.  Tender in the right upper quadrant Other:  Skin is intact no bruising   ED Results / Procedures / Treatments   Labs (all labs ordered are listed, but only abnormal results are displayed) Labs Reviewed  CBC WITH DIFFERENTIAL/PLATELET - Abnormal; Notable for the following components:      Result Value   Platelets 134 (*)    All other components within normal limits  COMPREHENSIVE METABOLIC PANEL - Abnormal; Notable for the following components:   Glucose, Bld 108 (*)    AST 13 (*)    All other components within normal limits  URINALYSIS, ROUTINE W REFLEX MICROSCOPIC - Abnormal; Notable for the following components:   Color, Urine STRAW (*)    APPearance CLEAR (*)     Specific Gravity, Urine 1.003 (*)    All other components within normal limits     EKG     RADIOLOGY X-ray of the chest and right rib CT chest abdomen pelvis    PROCEDURES:  Critical Care performed: No  Procedures   MEDICATIONS ORDERED IN ED: Medications  lidocaine (LIDODERM) 5 % 1 patch (has no administration in time range)  iohexol (OMNIPAQUE) 350 MG/ML injection 80 mL (80 mLs Intravenous Contrast Given 11/15/21 0947)     IMPRESSION / MDM / ASSESSMENT AND PLAN / ED COURSE  I reviewed the triage vital signs and the nursing notes.                              Differential diagnosis includes, but is not limited to, right rib fracture, rib contusion, liver laceration, blunt abdominal trauma  Patient is a 66 year old female with history of GERD, squamous cell carcinoma, diverticulitis and  IBS presents with right rib and abdominal pain.  Patient fell against a metal shell on Sunday.  Increasing pain in the right upper quadrant and right ribs.  States it hurts to take a deep breath. Physical exam shows patient be stable.  She is tender in the right upper quadrant  X-ray of the right ribs and chest are negative for any acute abnormality.  Due to the right upper quadrant tenderness concerns for liver laceration we will do CT chest abdomen pelvis  CT of the chest abdomen pelvis was reviewed by me.  Radiologist confirms there is no fracture or laceration on the liver  Labs are reassuring, CBC, metabolic panel, and urinalysis are all negative for any acute abnormality  I did discuss these findings with patient.  She was given a Lidoderm patch to the right ribs.  Prescription for tramadol for pain that is not controlled by Tylenol and ibuprofen.  She was given additional prescription for Lidoderm patches.  Patient drove her self so cannot give her narcotics or anything stronger for pain.  She is in agreement treatment plan.  She is to follow-up with her regular doctor as needed.   Return if worsening.       FINAL CLINICAL IMPRESSION(S) / ED DIAGNOSES   Final diagnoses:  Contusion of rib on right side, initial encounter     Rx / DC Orders   ED Discharge Orders          Ordered    lidocaine (LIDODERM) 5 %  Every 12 hours        11/15/21 1042    traMADol (ULTRAM) 50 MG tablet  Every 6 hours PRN        01 /10/23 1042             Note:  This document was prepared using Dragon voice recognition software and may include unintentional dictation errors.    Versie Starks, PA-C 11/15/21 1046    Lavonia Drafts, MD 11/15/21 1248

## 2021-11-15 NOTE — ED Triage Notes (Signed)
Pt states she ran into a metal shelf at [place] on Sunday and has been having right rib pain since, "hurts to breath"

## 2021-11-23 DIAGNOSIS — C519 Malignant neoplasm of vulva, unspecified: Secondary | ICD-10-CM | POA: Diagnosis not present

## 2021-11-23 DIAGNOSIS — N904 Leukoplakia of vulva: Secondary | ICD-10-CM | POA: Diagnosis not present

## 2021-11-23 DIAGNOSIS — Z01818 Encounter for other preprocedural examination: Secondary | ICD-10-CM | POA: Diagnosis not present

## 2021-11-23 DIAGNOSIS — Z8544 Personal history of malignant neoplasm of other female genital organs: Secondary | ICD-10-CM | POA: Diagnosis not present

## 2021-11-24 DIAGNOSIS — C519 Malignant neoplasm of vulva, unspecified: Secondary | ICD-10-CM | POA: Diagnosis not present

## 2021-11-24 DIAGNOSIS — K219 Gastro-esophageal reflux disease without esophagitis: Secondary | ICD-10-CM | POA: Diagnosis not present

## 2021-11-24 DIAGNOSIS — Z780 Asymptomatic menopausal state: Secondary | ICD-10-CM | POA: Diagnosis not present

## 2021-11-24 DIAGNOSIS — E785 Hyperlipidemia, unspecified: Secondary | ICD-10-CM | POA: Diagnosis not present

## 2021-11-24 DIAGNOSIS — Z801 Family history of malignant neoplasm of trachea, bronchus and lung: Secondary | ICD-10-CM | POA: Diagnosis not present

## 2021-11-24 DIAGNOSIS — G4700 Insomnia, unspecified: Secondary | ICD-10-CM | POA: Diagnosis not present

## 2021-11-24 DIAGNOSIS — Z5941 Food insecurity: Secondary | ICD-10-CM | POA: Diagnosis not present

## 2021-11-24 DIAGNOSIS — Z20822 Contact with and (suspected) exposure to covid-19: Secondary | ICD-10-CM | POA: Diagnosis not present

## 2021-11-24 DIAGNOSIS — R69 Illness, unspecified: Secondary | ICD-10-CM | POA: Diagnosis not present

## 2021-11-26 ENCOUNTER — Emergency Department: Payer: 59

## 2021-11-26 ENCOUNTER — Other Ambulatory Visit: Payer: Self-pay

## 2021-11-26 ENCOUNTER — Emergency Department
Admission: EM | Admit: 2021-11-26 | Discharge: 2021-11-27 | Disposition: A | Discharge status: Home / Self Care | Payer: 59 | Attending: Emergency Medicine | Admitting: Emergency Medicine

## 2021-11-26 DIAGNOSIS — K5903 Drug induced constipation: Secondary | ICD-10-CM | POA: Insufficient documentation

## 2021-11-26 DIAGNOSIS — R1032 Left lower quadrant pain: Secondary | ICD-10-CM | POA: Diagnosis present

## 2021-11-26 DIAGNOSIS — Z8544 Personal history of malignant neoplasm of other female genital organs: Secondary | ICD-10-CM | POA: Insufficient documentation

## 2021-11-26 DIAGNOSIS — Z9660 Presence of unspecified orthopedic joint implant: Secondary | ICD-10-CM | POA: Insufficient documentation

## 2021-11-26 DIAGNOSIS — T402X5A Adverse effect of other opioids, initial encounter: Secondary | ICD-10-CM

## 2021-11-26 DIAGNOSIS — K5900 Constipation, unspecified: Secondary | ICD-10-CM | POA: Diagnosis not present

## 2021-11-26 DIAGNOSIS — I700 Atherosclerosis of aorta: Secondary | ICD-10-CM | POA: Diagnosis not present

## 2021-11-26 DIAGNOSIS — R109 Unspecified abdominal pain: Secondary | ICD-10-CM | POA: Diagnosis not present

## 2021-11-26 LAB — CBC WITH DIFFERENTIAL/PLATELET
Abs Immature Granulocytes: 0.07 10*3/uL (ref 0.00–0.07)
Basophils Absolute: 0.1 10*3/uL (ref 0.0–0.1)
Basophils Relative: 1 %
Eosinophils Absolute: 0.2 10*3/uL (ref 0.0–0.5)
Eosinophils Relative: 2 %
HCT: 37.6 % (ref 36.0–46.0)
Hemoglobin: 12.2 g/dL (ref 12.0–15.0)
Immature Granulocytes: 1 %
Lymphocytes Relative: 14 %
Lymphs Abs: 1.6 10*3/uL (ref 0.7–4.0)
MCH: 30.3 pg (ref 26.0–34.0)
MCHC: 32.4 g/dL (ref 30.0–36.0)
MCV: 93.3 fL (ref 80.0–100.0)
Monocytes Absolute: 1.2 10*3/uL — ABNORMAL HIGH (ref 0.1–1.0)
Monocytes Relative: 10 %
Neutro Abs: 8.5 10*3/uL — ABNORMAL HIGH (ref 1.7–7.7)
Neutrophils Relative %: 72 %
Platelets: 161 10*3/uL (ref 150–400)
RBC: 4.03 MIL/uL (ref 3.87–5.11)
RDW: 13.6 % (ref 11.5–15.5)
WBC: 11.7 10*3/uL — ABNORMAL HIGH (ref 4.0–10.5)
nRBC: 0 % (ref 0.0–0.2)

## 2021-11-26 LAB — COMPREHENSIVE METABOLIC PANEL
ALT: 33 U/L (ref 0–44)
AST: 40 U/L (ref 15–41)
Albumin: 3.3 g/dL — ABNORMAL LOW (ref 3.5–5.0)
Alkaline Phosphatase: 101 U/L (ref 38–126)
Anion gap: 8 (ref 5–15)
BUN: 18 mg/dL (ref 8–23)
CO2: 25 mmol/L (ref 22–32)
Calcium: 8.7 mg/dL — ABNORMAL LOW (ref 8.9–10.3)
Chloride: 106 mmol/L (ref 98–111)
Creatinine, Ser: 1.04 mg/dL — ABNORMAL HIGH (ref 0.44–1.00)
GFR, Estimated: 59 mL/min — ABNORMAL LOW (ref >60)
Glucose, Bld: 103 mg/dL — ABNORMAL HIGH (ref 70–99)
Potassium: 4 mmol/L (ref 3.5–5.1)
Sodium: 139 mmol/L (ref 135–145)
Total Bilirubin: 0.4 mg/dL (ref 0.3–1.2)
Total Protein: 7.1 g/dL (ref 6.5–8.1)

## 2021-11-26 LAB — URINALYSIS, ROUTINE W REFLEX MICROSCOPIC
Bacteria, UA: NONE SEEN
Bilirubin Urine: NEGATIVE
Glucose, UA: NEGATIVE mg/dL
Ketones, ur: NEGATIVE mg/dL
Leukocytes,Ua: NEGATIVE
Nitrite: NEGATIVE
Protein, ur: NEGATIVE mg/dL
Specific Gravity, Urine: 1.014 (ref 1.005–1.030)
pH: 5 (ref 5.0–8.0)

## 2021-11-26 LAB — LIPASE, BLOOD: Lipase: 26 U/L (ref 11–51)

## 2021-11-26 MED ORDER — IOHEXOL 300 MG/ML  SOLN
80.0000 mL | Freq: Once | INTRAMUSCULAR | Status: AC | PRN
  Administered 2021-11-26: 80 mL via INTRAVENOUS

## 2021-11-26 MED ORDER — SODIUM CHLORIDE 0.9 % IV BOLUS
1000.0000 mL | Freq: Once | INTRAVENOUS | Status: AC
  Administered 2021-11-26: 1000 mL via INTRAVENOUS

## 2021-11-26 NOTE — ED Triage Notes (Signed)
Pt states that she had vulvar surgery d/t cancer on the 18th and that they also took out a lymph node at [facility]- pt is now having LLQ abd pain- pt endorses nausea, but denies v/d- pt has been constipated and unable to have a BM despite using sinna

## 2021-11-26 NOTE — ED Provider Triage Note (Signed)
Emergency Medicine Provider Triage Evaluation Note  [name] , a 66 y.o. female  was evaluated in triage.  Pt complains of left lower quadrant abdominal pain, nausea.  Patient was recently at [facility] for a vulvectomy secondary to vulvar cancer.  Patient had lymph nodes in her abdomen removed as well.  Patient has had worsening abdominal pain since her surgery, states that she feels extremely nauseated at this time.  No emesis but she is constipated.  No urinary changes..  Review of Systems  Positive: Left lower quadrant abdominal.,  Nausea, constipation Negative: Emesis, diarrhea, urinary changes, fevers or chills  Physical Exam  BP (!) 125/55 (BP Location: Right Arm)    Pulse 80    Temp 99.3 F (37.4 C) (Oral)    Resp 18    Ht 5\' 3"  (1.6 m)    Wt 49.9 kg    SpO2 100%    BMI 19.49 kg/m  Gen:   Awake, no distress   Resp:  Normal effort  MSK:   Moves extremities without difficulty  Other:  Tender to palpation left lower quadrant  Medical Decision Making  Medically screening exam initiated at 6:14 PM.  Appropriate orders placed.  [name] was informed that the remainder of the evaluation will be completed by another provider, this initial triage assessment does not replace that evaluation, and the importance of remaining in the ED until their evaluation is complete.  Patient presents with left lower quadrant pain, nausea, constipation since pelvic pain secondary to vulvar cancer.  Patient also had lymph nodes in her abdomen resected.  At this time patient will have labs, CT scan.   Darletta Moll, PA-C 11/26/21 1814

## 2021-11-27 MED ORDER — LACTULOSE 10 GM/15ML PO SOLN
30.0000 g | Freq: Once | ORAL | Status: AC
Start: 2021-11-27 — End: 2021-11-27
  Administered 2021-11-27: 30 g via ORAL
  Filled 2021-11-27: qty 60

## 2021-11-27 MED ORDER — LACTULOSE 20 GM/30ML PO SOLN: 30.0000 g | Freq: Two times a day (BID) | ORAL | 0 refills | Status: AC

## 2021-11-27 MED ORDER — LACTULOSE 20 GM/30ML PO SOLN
30.0000 g | Freq: Two times a day (BID) | ORAL | 0 refills | Status: DC
  Filled 2021-11-27: qty 450, 5d supply, fill #0

## 2021-11-27 NOTE — ED Provider Notes (Signed)
[facility] Provider Note    Event Date/Time   First MD Initiated Contact with Patient 11/27/21 [phone]     (approximate)   History   Abdominal Pain   HPI  [name] is a 66 y.o. female postop day 4 of a partial radical vulvectomy and pelvic lymph node biopsy for vulvar cancer who presents for evaluation of abdominal pain.  Patient has been on narcotic pain medication since the surgery.  Has not had a bowel movement since then.  She is complaining of left lower quadrant sharp intermittent severe abdominal pain.  She reports taking 1 Senokot and Linzess today with no significant relief.  She is passing flatus, she has no distention, no nausea or vomiting, no fever or chills, no dysuria or hematuria.     Past Medical History:  Diagnosis Date   Diverticulitis    Environmental allergies    GERD (gastroesophageal reflux disease)    IBS (irritable bowel syndrome)    Squamous cell carcinoma of skin 09/15/2021   L labia majora with lichen sclerosus - needs referral    Past Surgical History:  Procedure Laterality Date   ABDOMINAL HYSTERECTOMY     BLADDER SURGERY     CHOLECYSTECTOMY     FRACTURE SURGERY Left    ankle surgery   JOINT REPLACEMENT     TONSILLECTOMY       Physical Exam   Triage Vital Signs: ED Triage Vitals  Enc Vitals Group     BP 11/26/21 1809 (!) 125/55     Pulse Rate 11/26/21 1809 80     Resp 11/26/21 1809 18     Temp 11/26/21 1813 99.3 F (37.4 C)     Temp Source 11/26/21 1813 Oral     SpO2 11/26/21 1809 100 %     Weight 11/26/21 1811 110 lb (49.9 kg)     Height 11/26/21 1811 5\' 3"  (1.6 m)     Head Circumference --      Peak Flow --      Pain Score 11/26/21 1811 9     Pain Loc --      Pain Edu? --      Excl. in [place]? --     Most recent vital signs: Vitals:   11/26/21 2122 11/27/21 0104  BP: (!) 121/51 (!) 127/58  Pulse: 81 76  Resp: 20 19  Temp:    SpO2: 100% 99%     Constitutional: Alert and oriented. Well  appearing and in no apparent distress. HEENT:      Head: Normocephalic and atraumatic.         Eyes: Conjunctivae are normal. Sclera is non-icteric.       Mouth/Throat: Mucous membranes are moist.       Neck: Supple with no signs of meningismus. Cardiovascular: Regular rate and rhythm. No murmurs, gallops, or rubs. 2+ symmetrical distal pulses are present in all extremities.  Respiratory: Normal respiratory effort. Lungs are clear to auscultation bilaterally.  Gastrointestinal: Soft, hard stool palpated on the LLQ, and non distended with positive bowel sounds. No rebound or guarding. Well healing lymph node biopsy site Genitourinary: Well-healing surgical site with no signs of infection Musculoskeletal:  No edema, cyanosis, or erythema of extremities. Neurologic: Normal speech and language. Face is symmetric. Moving all extremities. No gross focal neurologic deficits are appreciated. Skin: Skin is warm, dry and intact. No rash noted. Psychiatric: Mood and affect are normal. Speech and behavior are normal.  ED Results / Procedures /  Treatments   Labs (all labs ordered are listed, but only abnormal results are displayed) Labs Reviewed  COMPREHENSIVE METABOLIC PANEL - Abnormal; Notable for the following components:      Result Value   Glucose, Bld 103 (*)    Creatinine, Ser 1.04 (*)    Calcium 8.7 (*)    Albumin 3.3 (*)    GFR, Estimated 59 (*)    All other components within normal limits  CBC WITH DIFFERENTIAL/PLATELET - Abnormal; Notable for the following components:   WBC 11.7 (*)    Neutro Abs 8.5 (*)    Monocytes Absolute 1.2 (*)    All other components within normal limits  URINALYSIS, ROUTINE W REFLEX MICROSCOPIC - Abnormal; Notable for the following components:   Color, Urine YELLOW (*)    APPearance CLEAR (*)    Hgb urine dipstick MODERATE (*)    All other components within normal limits  LIPASE, BLOOD     EKG  none   RADIOLOGY I, Rudene Re, attending MD,  have personally viewed and interpreted the images obtained during this visit as below:  CT concerning for constipation   ___________________________________________________ Interpretation by Radiologist:  CT ABDOMEN PELVIS W CONTRAST  Result Date: 11/26/2021 CLINICAL DATA:  Left lower quadrant pain history of vulvar surgery for cancer EXAM: CT ABDOMEN AND PELVIS WITH CONTRAST TECHNIQUE: Multidetector CT imaging of the abdomen and pelvis was performed using the standard protocol following bolus administration of intravenous contrast. RADIATION DOSE REDUCTION: This exam was performed according to the departmental dose-optimization program which includes automated exposure control, adjustment of the mA and/or kV according to patient size and/or use of iterative reconstruction technique. CONTRAST:  27mL OMNIPAQUE IOHEXOL 300 MG/ML  SOLN COMPARISON:  CT 11/15/2021, 06/18/2018, PET CT 10/17/2021 FINDINGS: Lower chest: Lung bases demonstrate no acute consolidation. Small right-sided pleural effusion. Hepatobiliary: Status post cholecystectomy. Stable intra and extrahepatic biliary dilatation. No focal hepatic abnormality. Pancreas: Unremarkable. No pancreatic ductal dilatation or surrounding inflammatory changes. Spleen: Normal in size without focal abnormality. Adrenals/Urinary Tract: Adrenal glands are normal. Kidneys show no hydronephrosis. Cyst off the midpole right kidney. The bladder is normal Stomach/Bowel: The stomach is nonenlarged. Fluid-filled nondistended loops of small bowel. Large volume of stool in the colon. No acute bowel wall thickening. Diverticular disease of the left colon without acute wall thickening. Vascular/Lymphatic: Moderate aortic atherosclerosis. No aneurysmal dilatation. Ectatic right common iliac artery measuring 17 mm. No suspicious lymph nodes. Reproductive: Status post hysterectomy. No adnexal masses. Other: Negative for pelvic effusion or free air Musculoskeletal: No fracture or  suspicious lesion. Small amount of gas within the subcutaneous soft tissues of the left groin. Generalized subcutaneous edema. No rim enhancing fluid collections to suggest abscess. Clips in the right paraspinal region. IMPRESSION: 1. Fluid-filled nondistended small bowel could be secondary to mild ileus. No definitive obstruction. Large volume of stool in the colon. Left colon diverticular disease without acute inflammatory change 2. Generalized subcutaneous edema. Small foci of air within the left groin and left lower quadrant subcutaneous soft tissues presumably related to recent surgery 3. Status post cholecystectomy with stable intra and extrahepatic biliary dilatation. 4. Small right-sided pleural effusion Electronically Signed   By: Donavan Foil M.D.   On: 11/26/2021 20:15      PROCEDURES:  Critical Care performed: No  Procedures    IMPRESSION / MDM / ASSESSMENT AND PLAN / ED COURSE  I reviewed the triage vital signs and the nursing notes.  66 y.o. female postop day 4 of a  partial radical vulvectomy and pelvic lymph node biopsy for vulvar cancer who presents for evaluation of abdominal pain.  Patient has been constipated since her surgery.  Tried 1 Senokot and Linzess today.  On exam her abdomen is soft and nondistended with palpable hard stool in the left quadrant.  Ddx: Constipation versus ileus versus SBO versus postop complication including intra-abdominal infection   Plan: CT, UA, CMP, CBC, lipase   MEDICATIONS GIVEN IN ED: Medications  sodium chloride 0.9 % bolus 1,000 mL (0 mLs Intravenous Stopped 11/27/21 0057)  iohexol (OMNIPAQUE) 300 MG/ML solution 80 mL (80 mLs Intravenous Contrast Given 11/26/21 1952)  lactulose (CHRONULAC) 10 GM/15ML solution 30 g (30 g Oral Given 11/27/21 0101)     ED COURSE: CT consistent with mild ileus and large volume constipation.  Patient was started on lactulose and given a dose in the emergency room.  Will discharge home on a twice daily dose  for the next 3 days.  Recommend increase water intake.  Surgical sites look well-appearing no signs of infection.  Creatinine is slightly elevated at 1.04 mostly from possible dehydration.  UA with no signs of UTI.  Recommended close follow-up with her surgeon.  Discussed signs and symptoms of obstruction or postop infection and recommended return if these develop.  Admission was considered but felt unnecessary since symptoms can be taking care of of as an outpatient.   Consults: None   EMR reviewed surgical notes from 4 days ago        FINAL CLINICAL IMPRESSION(S) / ED DIAGNOSES   Final diagnoses:  Constipation due to opioid therapy     Rx / DC Orders   ED Discharge Orders          Ordered    Lactulose 20 GM/30ML SOLN  2 times daily,   Status:  Discontinued        11/27/21 0114    Lactulose 20 GM/30ML SOLN  2 times daily        11/27/21 0117             Note:  This document was prepared using Dragon voice recognition software and may include unintentional dictation errors.   Alfred Levins, [place], MD 11/27/21 [phone]

## 2021-11-27 NOTE — Discharge Instructions (Signed)
Take lactulose as prescribed for the next 3 days.  Make sure to drink plenty of fluids.  Follow-up with your surgeon in 2 days.  Return to the emergency room if you have abdominal distention, nausea or vomiting.

## 2021-11-28 ENCOUNTER — Other Ambulatory Visit (HOSPITAL_BASED_OUTPATIENT_CLINIC_OR_DEPARTMENT_OTHER): Payer: Self-pay

## 2021-11-29 DIAGNOSIS — D696 Thrombocytopenia, unspecified: Secondary | ICD-10-CM | POA: Diagnosis not present

## 2021-11-29 DIAGNOSIS — E559 Vitamin D deficiency, unspecified: Secondary | ICD-10-CM | POA: Diagnosis not present

## 2021-11-29 DIAGNOSIS — Z23 Encounter for immunization: Secondary | ICD-10-CM | POA: Diagnosis not present

## 2021-11-29 DIAGNOSIS — K5909 Other constipation: Secondary | ICD-10-CM | POA: Diagnosis not present

## 2021-11-29 DIAGNOSIS — R69 Illness, unspecified: Secondary | ICD-10-CM | POA: Diagnosis not present

## 2021-11-29 DIAGNOSIS — R5381 Other malaise: Secondary | ICD-10-CM | POA: Diagnosis not present

## 2021-11-29 DIAGNOSIS — F339 Major depressive disorder, recurrent, unspecified: Secondary | ICD-10-CM | POA: Diagnosis not present

## 2021-11-29 DIAGNOSIS — C4492 Squamous cell carcinoma of skin, unspecified: Secondary | ICD-10-CM | POA: Diagnosis not present

## 2021-11-29 DIAGNOSIS — R5383 Other fatigue: Secondary | ICD-10-CM | POA: Diagnosis not present

## 2021-11-29 DIAGNOSIS — E785 Hyperlipidemia, unspecified: Secondary | ICD-10-CM | POA: Diagnosis not present

## 2021-12-05 DIAGNOSIS — Z6820 Body mass index (BMI) 20.0-20.9, adult: Secondary | ICD-10-CM | POA: Diagnosis not present

## 2021-12-05 DIAGNOSIS — E538 Deficiency of other specified B group vitamins: Secondary | ICD-10-CM | POA: Diagnosis not present

## 2021-12-05 DIAGNOSIS — R0600 Dyspnea, unspecified: Secondary | ICD-10-CM | POA: Diagnosis not present

## 2021-12-06 DIAGNOSIS — R0600 Dyspnea, unspecified: Secondary | ICD-10-CM | POA: Diagnosis not present

## 2021-12-15 DIAGNOSIS — Z08 Encounter for follow-up examination after completed treatment for malignant neoplasm: Secondary | ICD-10-CM | POA: Diagnosis not present

## 2021-12-15 DIAGNOSIS — C519 Malignant neoplasm of vulva, unspecified: Secondary | ICD-10-CM | POA: Diagnosis not present

## 2022-01-21 IMAGING — CT CT ABD-PELV W/ CM
2 of 5 series · 15 of 46 positions shown, 17 images · IV contrast (APPLIED)
Comparison: CT 11/15/2021, 06/18/2018, PET CT 10/17/2021

CLINICAL DATA: Left lower quadrant pain history of vulvar surgery
for cancer

EXAM:
CT ABDOMEN AND PELVIS WITH CONTRAST
TECHNIQUE: Multidetector CT imaging of the abdomen and pelvis was performed
using the standard protocol following bolus administration of
intravenous contrast.

[Series 2: routine abd/pel with · axial · 0.66mm/px · z∈[-437,-47]mm · 12 of 88 slices shown, 14 images]
[im 5/88  soft-tissue]
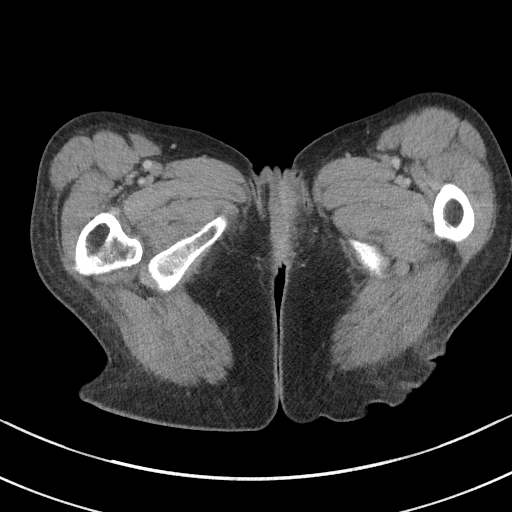
[im 5/88  bone]
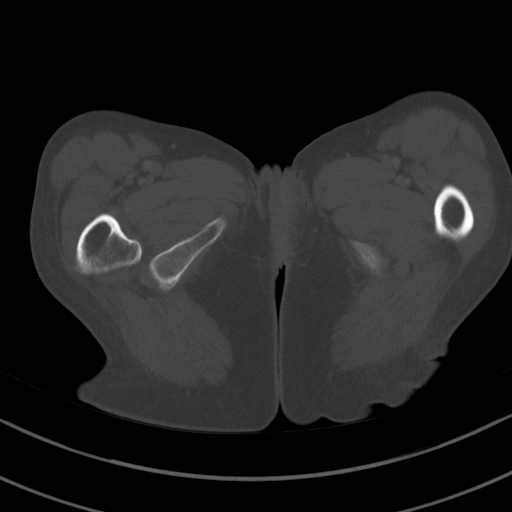
[im 15/88  soft-tissue]
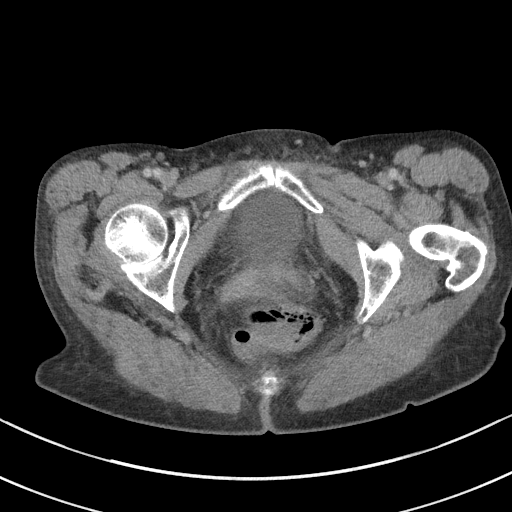
[im 20/88  soft-tissue]
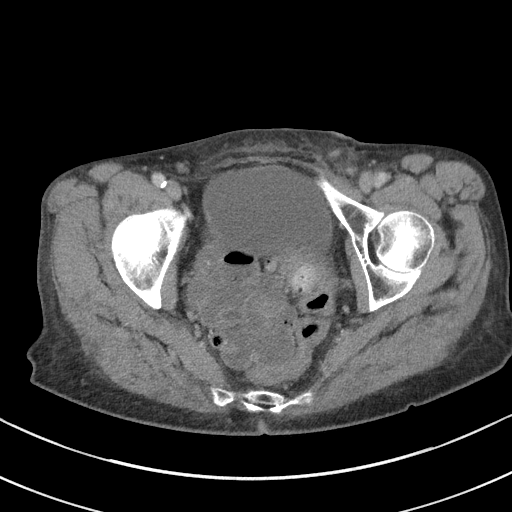
[im 25/88  soft-tissue]
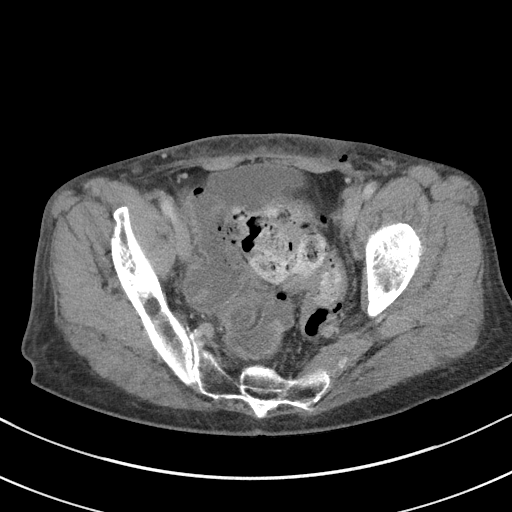
[im 34/88  soft-tissue]
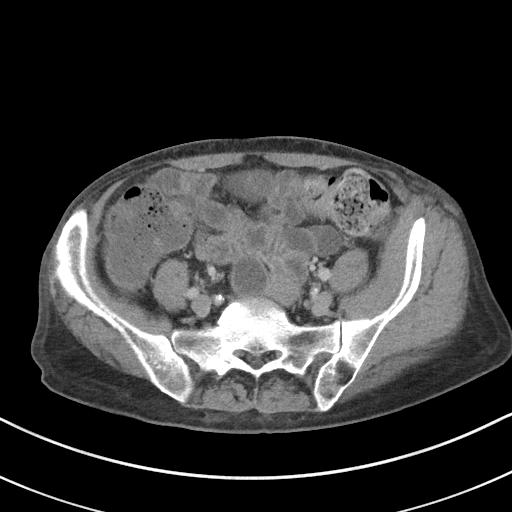
[im 39/88  soft-tissue]
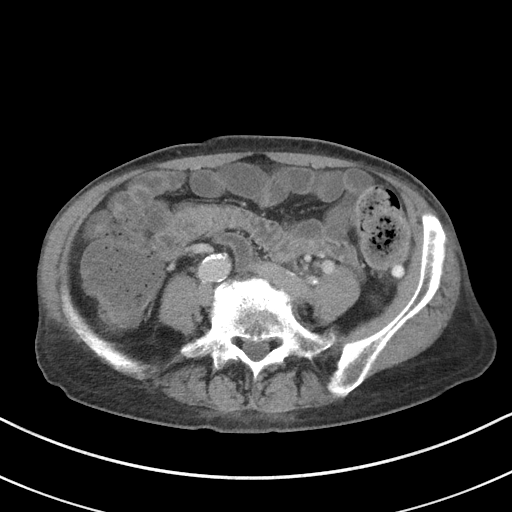
[im 49/88  soft-tissue]
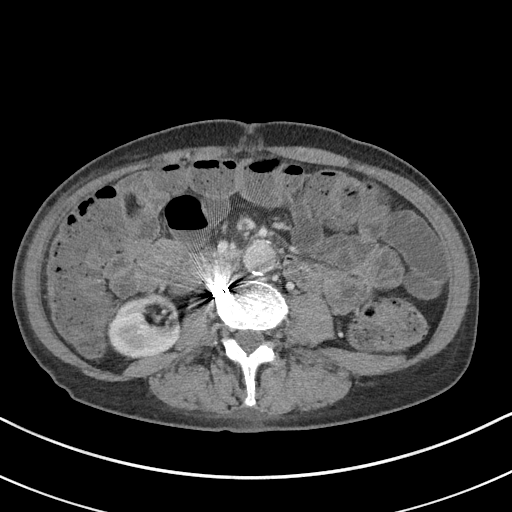
[im 54/88  soft-tissue]
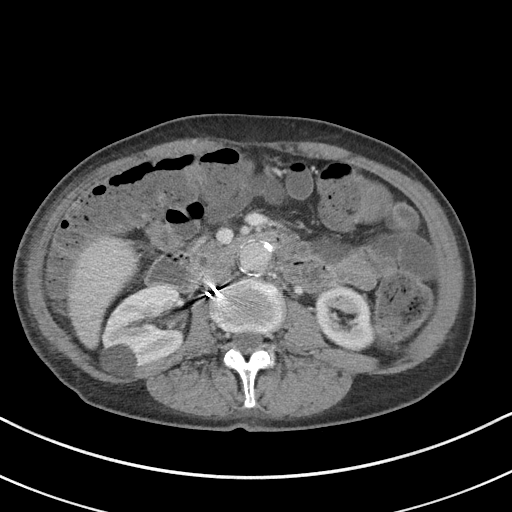
[im 63/88  soft-tissue]
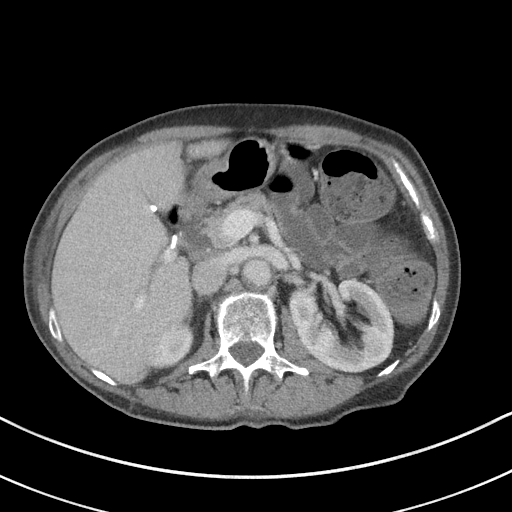
[im 63/88  bone]
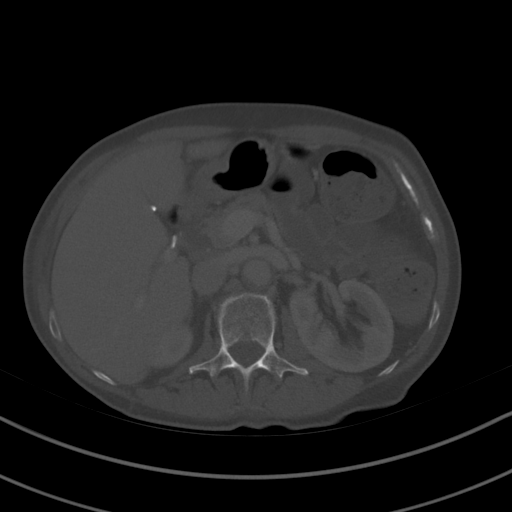
[im 68/88  soft-tissue]
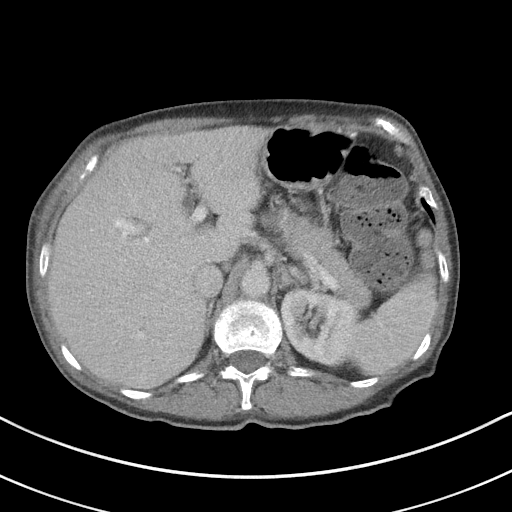
[im 73/88  soft-tissue]
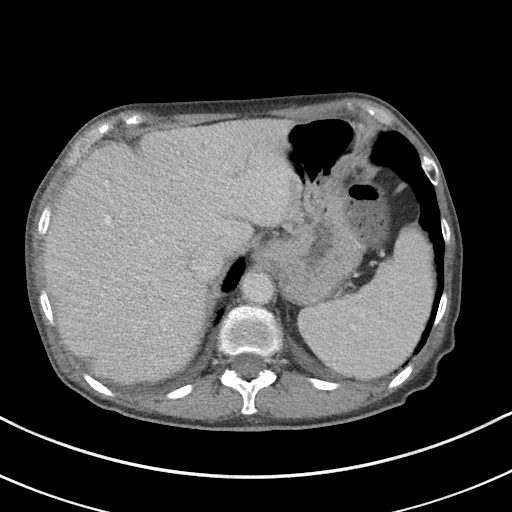
[im 83/88  soft-tissue]
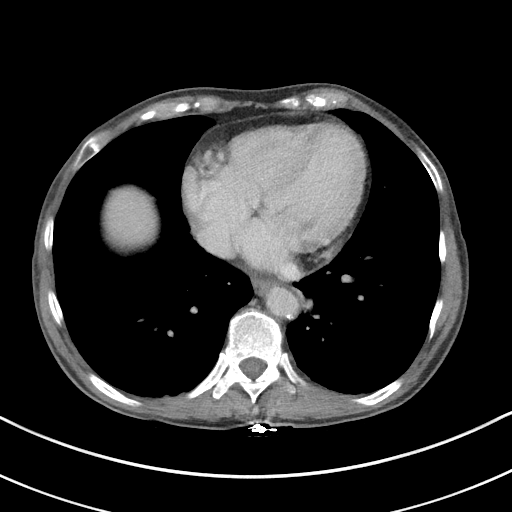

[Series 5: coronal st · coronal · 0.66mm/px · 3 of 81 slices shown]
[im 27/81  soft-tissue]
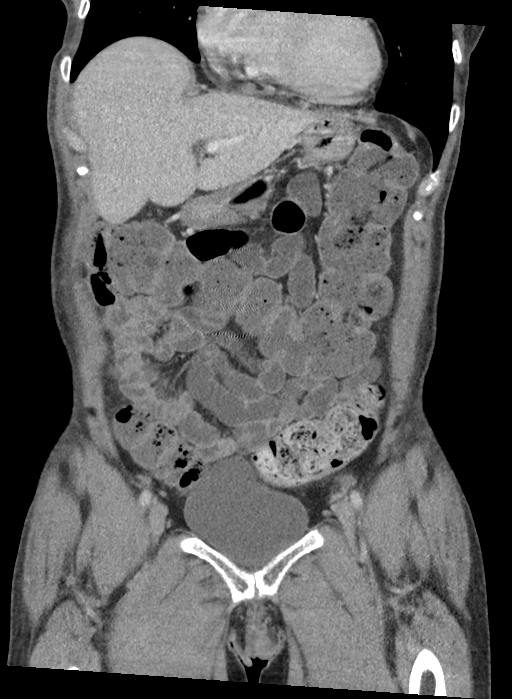
[im 36/81  soft-tissue]
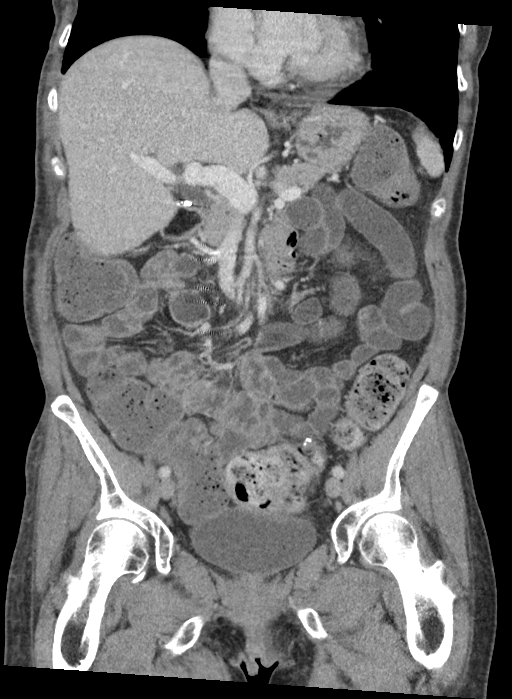
[im 45/81  soft-tissue]
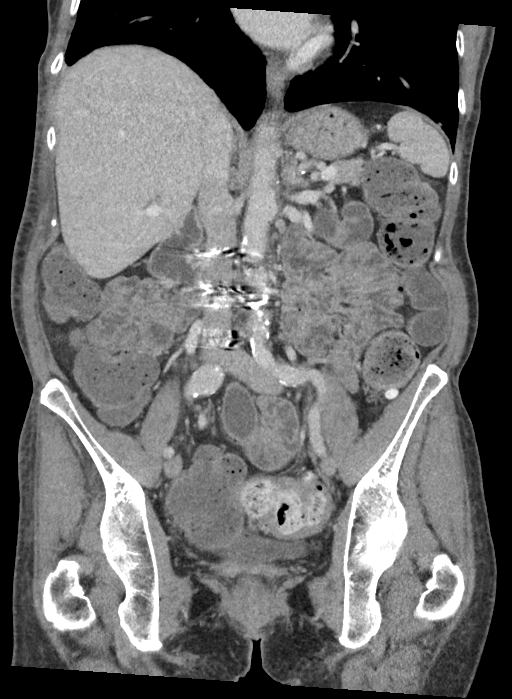

[15 of 46 positions shown; findings below may reference images not displayed]

RADIATION DOSE REDUCTION: This exam was performed according to the
departmental dose-optimization program which includes automated
exposure control, adjustment of the mA and/or kV according to
patient size and/or use of iterative reconstruction technique.

CONTRAST:  80mL OMNIPAQUE IOHEXOL 300 MG/ML  SOLN
FINDINGS: Lower chest: Lung bases demonstrate no acute consolidation. Small
right-sided pleural effusion.

Hepatobiliary: Status post cholecystectomy. Stable intra and
extrahepatic biliary dilatation. No focal hepatic abnormality.

Pancreas: Unremarkable. No pancreatic ductal dilatation or
surrounding inflammatory changes.

Spleen: Normal in size without focal abnormality.

Adrenals/Urinary Tract: Adrenal glands are normal. Kidneys show no
hydronephrosis. Cyst off the midpole right kidney. The bladder is
normal

Stomach/Bowel: The stomach is nonenlarged. Fluid-filled nondistended
loops of small bowel. Large volume of stool in the colon. No acute
bowel wall thickening. Diverticular disease of the left colon
without acute wall thickening.

Vascular/Lymphatic: Moderate aortic atherosclerosis. No aneurysmal
dilatation. Ectatic right common iliac artery measuring 17 mm. No
suspicious lymph nodes.

Reproductive: Status post hysterectomy. No adnexal masses.

Other: Negative for pelvic effusion or free air

Musculoskeletal: No fracture or suspicious lesion. Small amount of
gas within the subcutaneous soft tissues of the left groin.
Generalized subcutaneous edema. No rim enhancing fluid collections
to suggest abscess. Clips in the right paraspinal region.
IMPRESSION: 1. Fluid-filled nondistended small bowel could be secondary to mild
ileus. No definitive obstruction. Large volume of stool in the
colon. Left colon diverticular disease without acute inflammatory
change
2. Generalized subcutaneous edema. Small foci of air within the left
groin and left lower quadrant subcutaneous soft tissues presumably
related to recent surgery
3. Status post cholecystectomy with stable intra and extrahepatic
biliary dilatation.
4. Small right-sided pleural effusion

## 2022-01-25 ENCOUNTER — Ambulatory Visit: Payer: 59 | Admitting: Dermatology

## 2022-02-05 ENCOUNTER — Encounter: Payer: Self-pay | Admitting: Gynecologic Oncology

## 2022-02-06 ENCOUNTER — Telehealth: Payer: Self-pay | Admitting: *Deleted

## 2022-02-06 NOTE — Telephone Encounter (Signed)
Spoke with pt this afternoon who stated that she did have her surgery in [place] but wants to move her care to [place] because they are out of network with her insurance and we are in network. Scheduled an appointment for pt to f/u with Dr.Tucker for Friday April 14, 2pm. Pt verbalized understanding and did not voice any other concerns.  ?

## 2022-02-06 NOTE — Telephone Encounter (Signed)
Spoke with pt this morning. She states that about 2 days ago she noticed redness and irration on the right side of her clitoris. She's localized to just that area. It only causes her pain when she touches it and when she wears tight clothing that rubs up again it. The pain is a 7/10 when it is irritated.  It does burn with urination. No discharge was reported. She stated this appeared suddenly and she does not know the cause of it. Joylene John, NP made aware.  ?

## 2022-02-06 NOTE — Telephone Encounter (Signed)
Spoke with pt this afternoon to inform her that per Joylene John, NP pt can use a spritz bottle while urinating to help decrease the burning and put some Vaseline on the area to help reduce the irration. Pt verbalized understanding and did not voice any other concerns.   ?

## 2022-02-17 ENCOUNTER — Other Ambulatory Visit: Payer: Self-pay

## 2022-02-17 ENCOUNTER — Inpatient Hospital Stay: Payer: 59 | Attending: Gynecologic Oncology | Admitting: Gynecologic Oncology

## 2022-02-17 VITALS — BP 120/59 | HR 73 | Temp 97.5°F | Resp 16 | Ht 63.0 in | Wt 109.6 lb

## 2022-02-17 DIAGNOSIS — Z7189 Other specified counseling: Secondary | ICD-10-CM

## 2022-02-17 DIAGNOSIS — L900 Lichen sclerosus et atrophicus: Secondary | ICD-10-CM | POA: Insufficient documentation

## 2022-02-17 DIAGNOSIS — C519 Malignant neoplasm of vulva, unspecified: Secondary | ICD-10-CM | POA: Insufficient documentation

## 2022-02-17 DIAGNOSIS — Z9079 Acquired absence of other genital organ(s): Secondary | ICD-10-CM | POA: Diagnosis not present

## 2022-02-17 NOTE — Patient Instructions (Addendum)
It was good to see you today.  You are healing well from surgery.  The incision has almost completely healed.  I would like to see you again in 3 months just to check the incision again to assure it has completely healed.  Please call if you develop any symptoms before your next visit with me. ? ?Please use the steroid cream that I prescribed in December if you have areas that are starting to itch; if you do, please apply the cream at night 3 times a week.  Let me know if the itching does not improve when she start using the cream.  This cream helps treat the skin condition that put you at risk of the cancer. ?

## 2022-02-17 NOTE — Progress Notes (Signed)
Gynecologic Oncology Return Clinic Visit ? ?02/17/2022 ? ?Reason for Visit: Follow-up after recent surgery ? ?Treatment History: ?10/2021 Biopsy: Vulvar biopsy revealed small area of SCC in the background of lichen sclerosis.  3 mm area of squamous cell carcinoma, well differentiated, depth of invasion 1.7 mm.  No perineural invasion.  Margins negative for carcinoma.  Lichen sclerosis present at the edge of the specimen. ? ?23/23/33: PET scan negative for metastatic disease. ? ?11/23/2021 Surgery: Left partial radical vulvectomy, left inguinofemoral sentinel lymph node biopsy. ?Findings: Left anterior, medial labia majora with 1cm area of scar from prior vulvectomy, well healed, with no mass. Area is approximately 3cm lateral to the level of the inferior clitoral hood. No other lesions on the vulva. No palpable inguinofemoral lymphadenopathy. On sentinel lymph node evaluation, left inguinofemoral lymph node identified, green and hot.  ?Pathology: Inguinal sentinel lymph node negative for metastatic cancer.  Left vulvar incision with benign skin with lichen sclerosus but no residual squamous cell carcinoma.  Margins all negative. ? ?11/30/2021 Tumor Board  ?Stage: 1B vulvar squamous cell carcinoma with no residual disease on partial radical vulvectomy, prior vulvar biopsy with invasive squamous cell carcinoma, grade 1, depth of invasion 1.7 mm, 3 mm horizontal extent ?Plan: Observation. ? ?Interval History: ?Patient reports overall doing well.  Has some intermittent vulvar itching around the area of her healed incision.  Denies any burning, pain, bleeding, or discharge.  Reports baseline constipation.  Denies any urinary symptoms. ? ?Past Medical/Surgical History: ?Past Medical History:  ?Diagnosis Date  ? Diverticulitis   ? Environmental allergies   ? GERD (gastroesophageal reflux disease)   ? IBS (irritable bowel syndrome)   ? Squamous cell carcinoma of skin 09/15/2021  ? L labia majora with lichen sclerosus - needs  referral  ? ? ?Past Surgical History:  ?Procedure Laterality Date  ? ABDOMINAL HYSTERECTOMY    ? BLADDER SURGERY    ? CHOLECYSTECTOMY    ? FRACTURE SURGERY Left   ? ankle surgery  ? JOINT REPLACEMENT    ? TONSILLECTOMY    ? ? ?Family History  ?Problem Relation Age of Onset  ? Lung cancer Maternal Aunt   ? ? ?Social History  ? ?Socioeconomic History  ? Marital status: Single  ?  Spouse name: Not on file  ? Number of children: Not on file  ? Years of education: Not on file  ? Highest education level: Not on file  ?Occupational History  ? Not on file  ?Tobacco Use  ? Smoking status: Every Day  ?  Packs/day: 0.50  ?  Types: Cigarettes  ? Smokeless tobacco: Never  ?Vaping Use  ? Vaping Use: Never used  ?Substance and Sexual Activity  ? Alcohol use: No  ? Drug use: No  ? Sexual activity: Not Currently  ?Other Topics Concern  ? Not on file  ?Social History Narrative  ? Not on file  ? ?Social Determinants of Health  ? ?Financial Resource Strain: Not on file  ?Food Insecurity: Not on file  ?Transportation Needs: Not on file  ?Physical Activity: Not on file  ?Stress: Not on file  ?Social Connections: Not on file  ? ? ?Current Medications: ? ?Current Outpatient Medications:  ?  acetaminophen (TYLENOL) 500 MG tablet, Take 1,000 mg by mouth 3 (three) times daily as needed. For pain, Disp: , Rfl:  ?  amitriptyline (ELAVIL) 150 MG tablet, Take 150 mg by mouth Nightly., Disp: , Rfl:  ?  Cholecalciferol (VITAMIN D) 50 MCG (2000 UT) tablet,  Take 2,000 Units by mouth daily., Disp: , Rfl:  ?  citalopram (CELEXA) 40 MG tablet, Take 40 mg by mouth daily.  , Disp: , Rfl:  ?  clobetasol ointment (TEMOVATE) 2.97 %, Apply 1 application topically 3 (three) times a week. Use small amount on vulva 3 times a week at night, Disp: 30 g, Rfl: 2 ?  clonazePAM (KLONOPIN) 0.5 MG tablet, Take 0.5 mg by mouth 2 (two) times daily. , Disp: , Rfl:  ?  escitalopram (LEXAPRO) 20 MG tablet, Take 30 mg by mouth daily., Disp: , Rfl:  ?  lidocaine (LIDODERM) 5  %, Place 1 patch onto the skin every 12 (twelve) hours. Remove & Discard patch within 12 hours or as directed by MD, Disp: 10 patch, Rfl: 0 ?  linaclotide (LINZESS) 290 MCG CAPS capsule, Take 1 capsule by mouth daily., Disp: , Rfl:  ?  montelukast (SINGULAIR) 10 MG tablet, Take 10 mg by mouth Nightly., Disp: , Rfl:  ?  pantoprazole (PROTONIX) 40 MG tablet, Take 40 mg by mouth 2 (two) times daily., Disp: , Rfl:  ?  polyethylene glycol powder (GLYCOLAX/MIRALAX) powder, Mix 1 capful in drink and take by mouth 1 to 3 times daily as needed for soft stools OTC, Disp: 250 g, Rfl: 1 ?  QUEtiapine Fumarate (SEROQUEL XR) 150 MG 24 hr tablet, Take by mouth., Disp: , Rfl:  ?  sertraline (ZOLOFT) 100 MG tablet, Take 1 tablet by mouth daily., Disp: , Rfl:  ?  simvastatin (ZOCOR) 20 MG tablet, Take 20 mg by mouth at bedtime.  , Disp: , Rfl:  ?  terbinafine (LAMISIL) 250 MG tablet, Take 250 mg by mouth daily., Disp: , Rfl:  ?  tiZANidine (ZANAFLEX) 2 MG tablet, Take 2 mg by mouth every 6 (six) hours as needed., Disp: , Rfl:  ?  traMADol (ULTRAM) 50 MG tablet, Take 1 tablet (50 mg total) by mouth every 6 (six) hours as needed., Disp: 15 tablet, Rfl: 0 ? ?Review of Systems: ?Denies appetite changes, fevers, chills, fatigue, unexplained weight changes. ?Denies hearing loss, neck lumps or masses, mouth sores, ringing in ears or voice changes. ?Denies cough or wheezing.  Denies shortness of breath. ?Denies chest pain or palpitations. Denies leg swelling. ?Denies abdominal distention, pain, blood in stools, constipation, diarrhea, nausea, vomiting, or early satiety. ?Denies pain with intercourse, dysuria, frequency, hematuria or incontinence. ?Denies hot flashes, pelvic pain, vaginal bleeding or vaginal discharge.   ?Denies joint pain, back pain or muscle pain/cramps. ?Denies rash, or wounds. ?Denies dizziness, headaches, numbness or seizures. ?Denies swollen lymph nodes or glands, denies easy bruising or bleeding. ?Denies anxiety,  depression, confusion, or decreased concentration. ? ?Physical Exam: ?BP (!) 120/59 (BP Location: Left Arm, Patient Position: Sitting)   Pulse 73   Temp (!) 97.5 ?F (36.4 ?C) (Tympanic)   Resp 16   Ht '5\' 3"'$  (1.6 m)   Wt 109 lb 9.6 oz (49.7 kg)   SpO2 100%   BMI 19.41 kg/m?  ?General: Alert, oriented, no acute distress. ?HEENT: Normocephalic, atraumatic, sclera anicteric. ?Chest: Unlabored breathing on room air. ?Abdomen: soft, nontender.  Normoactive bowel sounds.  No masses or hepatosplenomegaly appreciated.  Well-healed left groin incision. ?Extremities: Grossly normal range of motion.  Warm, well perfused.  No edema bilaterally. ?Skin: No rashes or lesions noted. ?Lymphatics: No cervical, supraclavicular, or inguinal adenopathy. ?GU: Normal appearing external genitalia without erythema, excoriation, or lesions.  Well-healed left upper vulvar incision, almost entirely healed.  No visible vulvar lesions or  atypical vascularity. ? ?Laboratory & Radiologic Studies: ?None new ? ?Assessment & Plan: ?[name] is a 66 y.o. woman with Stage IB well-differentiated squamous cell carcinoma arising in the setting of lichen sclerosis who presents for follow-up approximately 2 months after her recent surgery. ? ?Patient is overall doing well from a postoperative standpoint and her incision is almost completely healed.  She endorses some pruritus which I suspect is related to lichen sclerosus.  I have encouraged her to restart her clobetasol ointment which I prescribed her in December.  I would like her to use this 3 times a week in the evening if she is having pruritic symptoms.  I have asked that she call if this does not help improve her symptoms.  I do not see any lesions on exam today.  Given still healing incision, we will plan to have her come back in 3 months for vulvoscopy. ? ?28 minutes of total time was spent for this patient encounter, including preparation, face-to-face counseling with the patient and  coordination of care, and documentation of the encounter. ? ?Jeral Pinch, MD  ?Division of Gynecologic Oncology  ?Department of Obstetrics and Gynecology  ?[facility]  ? ?

## 2022-02-28 DIAGNOSIS — J0100 Acute maxillary sinusitis, unspecified: Secondary | ICD-10-CM | POA: Diagnosis not present

## 2022-02-28 DIAGNOSIS — Z6820 Body mass index (BMI) 20.0-20.9, adult: Secondary | ICD-10-CM | POA: Diagnosis not present

## 2022-03-10 DIAGNOSIS — R69 Illness, unspecified: Secondary | ICD-10-CM | POA: Diagnosis not present

## 2022-03-10 DIAGNOSIS — Z6820 Body mass index (BMI) 20.0-20.9, adult: Secondary | ICD-10-CM | POA: Diagnosis not present

## 2022-03-16 DIAGNOSIS — Z6820 Body mass index (BMI) 20.0-20.9, adult: Secondary | ICD-10-CM | POA: Diagnosis not present

## 2022-03-16 DIAGNOSIS — R69 Illness, unspecified: Secondary | ICD-10-CM | POA: Diagnosis not present

## 2022-03-16 DIAGNOSIS — J0100 Acute maxillary sinusitis, unspecified: Secondary | ICD-10-CM | POA: Diagnosis not present

## 2022-03-16 DIAGNOSIS — Z1389 Encounter for screening for other disorder: Secondary | ICD-10-CM | POA: Diagnosis not present

## 2022-03-24 DIAGNOSIS — R69 Illness, unspecified: Secondary | ICD-10-CM | POA: Diagnosis not present

## 2022-03-24 DIAGNOSIS — Z6820 Body mass index (BMI) 20.0-20.9, adult: Secondary | ICD-10-CM | POA: Diagnosis not present

## 2022-03-24 DIAGNOSIS — F32A Depression, unspecified: Secondary | ICD-10-CM | POA: Diagnosis not present

## 2022-04-01 DIAGNOSIS — H524 Presbyopia: Secondary | ICD-10-CM | POA: Diagnosis not present

## 2022-04-24 DIAGNOSIS — R69 Illness, unspecified: Secondary | ICD-10-CM | POA: Diagnosis not present

## 2022-04-24 DIAGNOSIS — F5101 Primary insomnia: Secondary | ICD-10-CM | POA: Diagnosis not present

## 2022-04-24 DIAGNOSIS — Z6821 Body mass index (BMI) 21.0-21.9, adult: Secondary | ICD-10-CM | POA: Diagnosis not present

## 2022-05-25 ENCOUNTER — Encounter: Payer: Self-pay | Admitting: Gynecologic Oncology

## 2022-05-29 ENCOUNTER — Inpatient Hospital Stay: Payer: No Typology Code available for payment source | Attending: Gynecologic Oncology | Admitting: Gynecologic Oncology

## 2022-05-29 ENCOUNTER — Encounter: Payer: Self-pay | Admitting: Gynecologic Oncology

## 2022-05-29 ENCOUNTER — Other Ambulatory Visit: Payer: Self-pay

## 2022-05-29 VITALS — BP 107/80 | HR 87 | Temp 98.4°F | Resp 14 | Ht 63.0 in | Wt 112.0 lb

## 2022-05-29 DIAGNOSIS — L292 Pruritus vulvae: Secondary | ICD-10-CM | POA: Diagnosis not present

## 2022-05-29 DIAGNOSIS — F17210 Nicotine dependence, cigarettes, uncomplicated: Secondary | ICD-10-CM | POA: Insufficient documentation

## 2022-05-29 DIAGNOSIS — Z8544 Personal history of malignant neoplasm of other female genital organs: Secondary | ICD-10-CM | POA: Insufficient documentation

## 2022-05-29 DIAGNOSIS — Z9079 Acquired absence of other genital organ(s): Secondary | ICD-10-CM | POA: Insufficient documentation

## 2022-05-29 DIAGNOSIS — L900 Lichen sclerosus et atrophicus: Secondary | ICD-10-CM | POA: Diagnosis not present

## 2022-05-29 DIAGNOSIS — C519 Malignant neoplasm of vulva, unspecified: Secondary | ICD-10-CM

## 2022-05-29 MED ORDER — CLOBETASOL PROPIONATE 0.05 % EX OINT: 1.0000 | TOPICAL_OINTMENT | CUTANEOUS | 2 refills | Status: DC

## 2022-05-29 NOTE — Patient Instructions (Addendum)
It was good to see you today.  I will call you when I have the biopsy results back from today.  I'm going to send in a prescription for clobetasol to your pharmacy, please use this 2 or 3 times a week at night to help with your itching symptoms.  We will tentatively get you scheduled for a follow-up visit in 3 months although we may have to change this depending on biopsy results from today.

## 2022-05-29 NOTE — Progress Notes (Signed)
Gynecologic Oncology Return Clinic Visit  05/29/22  Reason for Visit: surveillance in the setting of early vulvar cancer  Treatment History: 10/2021 Biopsy: Vulvar biopsy revealed small area of SCC in the background of lichen sclerosis.  3 mm area of squamous cell carcinoma, well differentiated, depth of invasion 1.7 mm.  No perineural invasion.  Margins negative for carcinoma.  Lichen sclerosis present at the edge of the specimen.   23/23/33: PET scan negative for metastatic disease.  11/23/2021 Surgery: Left partial radical vulvectomy, left inguinofemoral sentinel lymph node biopsy. Findings: Left anterior, medial labia majora with 1cm area of scar from prior vulvectomy, well healed, with no mass. Area is approximately 3cm lateral to the level of the inferior clitoral hood. No other lesions on the vulva. No palpable inguinofemoral lymphadenopathy. On sentinel lymph node evaluation, left inguinofemoral lymph node identified, green and hot.  Pathology: Inguinal sentinel lymph node negative for metastatic cancer.  Left vulvar incision with benign skin with lichen sclerosus but no residual squamous cell carcinoma.  Margins all negative.  11/30/2021 Tumor Board  Stage: 1B vulvar squamous cell carcinoma with no residual disease on partial radical vulvectomy, prior vulvar biopsy with invasive squamous cell carcinoma, grade 1, depth of invasion 1.7 mm, 3 mm horizontal extent Plan: Observation.  Interval History: Reports doing well.  Has had some continued intermittent vulvar pruritus, unchanged from her last visit.  Ran out of clobetasol.  Was noting that this helped with her symptoms some.  Continues to have some pain at the left groin incision, unchanged since surgery.  Continues to struggle with constipation, uses Linzess.  Denies any urinary symptoms.  Denies any vaginal bleeding or discharge.  Past Medical/Surgical History: Past Medical History:  Diagnosis Date   Diverticulitis    Environmental  allergies    GERD (gastroesophageal reflux disease)    IBS (irritable bowel syndrome)    Squamous cell carcinoma of skin 09/15/2021   L labia majora with lichen sclerosus - needs referral    Past Surgical History:  Procedure Laterality Date   ABDOMINAL HYSTERECTOMY     BLADDER SURGERY     CHOLECYSTECTOMY     FRACTURE SURGERY Left    ankle surgery   JOINT REPLACEMENT     TONSILLECTOMY      Family History  Problem Relation Age of Onset   Lung cancer Maternal Aunt     Social History   Socioeconomic History   Marital status: Single    Spouse name: Not on file   Number of children: Not on file   Years of education: Not on file   Highest education level: Not on file  Occupational History   Not on file  Tobacco Use   Smoking status: Every Day    Packs/day: 0.50    Types: Cigarettes   Smokeless tobacco: Never  Vaping Use   Vaping Use: Never used  Substance and Sexual Activity   Alcohol use: Yes    Comment: Occ   Drug use: No   Sexual activity: Not Currently  Other Topics Concern   Not on file  Social History Narrative   Not on file   Social Determinants of Health   Financial Resource Strain: Not on file  Food Insecurity: Not on file  Transportation Needs: Not on file  Physical Activity: Not on file  Stress: Not on file  Social Connections: Not on file    Current Medications:  Current Outpatient Medications:    acetaminophen (TYLENOL) 500 MG tablet, Take 1,000 mg by  mouth 3 (three) times daily as needed. For pain, Disp: , Rfl:    amitriptyline (ELAVIL) 150 MG tablet, Take 150 mg by mouth Nightly., Disp: , Rfl:    citalopram (CELEXA) 40 MG tablet, Take 40 mg by mouth daily.  , Disp: , Rfl:    clobetasol ointment (TEMOVATE) 8.84 %, Apply 1 Application topically 3 (three) times a week. Use small amount on vulva 3 times a week at night, Disp: 30 g, Rfl: 2   clonazePAM (KLONOPIN) 0.5 MG tablet, Take 0.5 mg by mouth 2 (two) times daily. , Disp: , Rfl:     escitalopram (LEXAPRO) 20 MG tablet, Take 30 mg by mouth daily., Disp: , Rfl:    linaclotide (LINZESS) 290 MCG CAPS capsule, Take 1 capsule by mouth daily., Disp: , Rfl:    montelukast (SINGULAIR) 10 MG tablet, Take 10 mg by mouth Nightly., Disp: , Rfl:    pantoprazole (PROTONIX) 40 MG tablet, Take 40 mg by mouth 2 (two) times daily., Disp: , Rfl:    QUEtiapine Fumarate (SEROQUEL XR) 150 MG 24 hr tablet, Take by mouth., Disp: , Rfl:    sertraline (ZOLOFT) 100 MG tablet, Take 1 tablet by mouth daily., Disp: , Rfl:    simvastatin (ZOCOR) 20 MG tablet, Take 20 mg by mouth at bedtime.  , Disp: , Rfl:    tiZANidine (ZANAFLEX) 2 MG tablet, Take 2 mg by mouth every 6 (six) hours as needed., Disp: , Rfl:    traMADol (ULTRAM) 50 MG tablet, Take 1 tablet (50 mg total) by mouth every 6 (six) hours as needed., Disp: 15 tablet, Rfl: 0  Review of Systems: + Constipation, left groin pain, vulvar pruritus Denies appetite changes, fevers, chills, fatigue, unexplained weight changes. Denies hearing loss, neck lumps or masses, mouth sores, ringing in ears or voice changes. Denies cough or wheezing.  Denies shortness of breath. Denies chest pain or palpitations. Denies leg swelling. Denies abdominal distention, pain, blood in stools, diarrhea, nausea, vomiting, or early satiety. Denies pain with intercourse, dysuria, frequency, hematuria or incontinence. Denies hot flashes, vaginal bleeding or vaginal discharge.   Denies joint pain, back pain or muscle pain/cramps. Denies rash, or wounds. Denies dizziness, headaches, numbness or seizures. Denies swollen lymph nodes or glands, denies easy bruising or bleeding. Denies anxiety, depression, confusion, or decreased concentration.  Physical Exam: BP 107/80 (BP Location: Left Arm, Patient Position: Sitting)   Pulse 87   Temp 98.4 F (36.9 C) (Oral)   Resp 14   Ht '5\' 3"'$  (1.6 m)   Wt 112 lb (50.8 kg)   BMI 19.84 kg/m  General: Alert, oriented, no acute  distress. HEENT: Normocephalic, atraumatic, sclera anicteric. Chest: Unlabored breathing on room air. Abdomen: soft, nontender.  Normoactive bowel sounds.  No masses or hepatosplenomegaly appreciated.  Extremities: Grossly normal range of motion.  Warm, well perfused.  No edema bilaterally. Skin: No rashes or lesions noted. Lymphatics: No cervical, supraclavicular, or inguinal adenopathy.  Minimal amount of scar tissue appreciated under the left inguinal incision, no discernible adenopathy. GU: Normal appearing reveals some mild erythema along the left labia, just lateral to her healed incision, almost eczematoid in appearance with mild prominent blood vessels.  Biopsy taken here.  No other visible lesions on the vulva.  Biopsy note Preoperative diagnosis: Vulvar pruritus, skin changes Postoperative diagnosis: Same as above Procedure: Vulvar biopsy Specimen: Vulvar biopsy, 2:00 Estimated blood loss: Minimal Procedure: After the procedure was discussed with the patient including risks and benefits, she gave verbal consent.  She was already in dorsolithotomy position.  The area to be biopsied was cleansed with Betadine x3.  Approximately 2 cc of 1% lidocaine was then injected for local anesthesia.  After adequate time was given for anesthesia effect, a 3 mm punch biopsy was taken.  This was excised using Metzenbaum scissors and placed in formalin.  Hemostasis was achieved with silver nitrate and pressure.  Overall the patient tolerated the procedure well.  Laboratory & Radiologic Studies: None new  Assessment & Plan: [name] is a 66 y.o. woman with Stage IB well-differentiated squamous cell carcinoma arising in the setting of lichen sclerosis who presents for surveillance visit.   Patient is doing well.  Based on exam findings today, biopsy was recommended.  This was performed today without difficulty.  I will call the patient back with these results.  I suspect that her biopsy will  reveal lichen sclerosis.  She had run out of her prescription of clobetasol.  I have sent a new prescription for this to her pharmacy.  We discussed using this 2-3 times a week in the evening when she has symptoms.  We will tentatively plan for a follow-up visit in 3 months.  We reviewed signs and symptoms that should prompt a phone call before her neck scheduled visit.  22 minutes of total time was spent for this patient encounter, including preparation, face-to-face counseling with the patient and coordination of care, and documentation of the encounter.  Jeral Pinch, MD  Division of Gynecologic Oncology  Department of Obstetrics and Gynecology  [facility]

## 2022-05-30 ENCOUNTER — Telehealth: Payer: Self-pay

## 2022-05-30 LAB — SURGICAL PATHOLOGY

## 2022-05-30 NOTE — Telephone Encounter (Signed)
LVM for patient to call back.  Per Dr.Tucker biopsy from yesterday shows lichen sclerosis.This is great news (no cancer).  Keep using the clobesatol cream 2-3 x a week

## 2022-05-31 NOTE — Telephone Encounter (Signed)
Pt is aware of biopsy results.

## 2022-06-12 ENCOUNTER — Ambulatory Visit: Payer: No Typology Code available for payment source | Admitting: Dermatology

## 2022-06-12 ENCOUNTER — Encounter: Payer: Self-pay | Admitting: Dermatology

## 2022-06-12 DIAGNOSIS — L853 Xerosis cutis: Secondary | ICD-10-CM

## 2022-06-12 DIAGNOSIS — L609 Nail disorder, unspecified: Secondary | ICD-10-CM | POA: Diagnosis not present

## 2022-06-12 DIAGNOSIS — Z1283 Encounter for screening for malignant neoplasm of skin: Secondary | ICD-10-CM

## 2022-06-12 DIAGNOSIS — L814 Other melanin hyperpigmentation: Secondary | ICD-10-CM | POA: Diagnosis not present

## 2022-06-12 DIAGNOSIS — L900 Lichen sclerosus et atrophicus: Secondary | ICD-10-CM | POA: Diagnosis not present

## 2022-06-12 DIAGNOSIS — L821 Other seborrheic keratosis: Secondary | ICD-10-CM

## 2022-06-12 DIAGNOSIS — Z85828 Personal history of other malignant neoplasm of skin: Secondary | ICD-10-CM

## 2022-06-12 DIAGNOSIS — L578 Other skin changes due to chronic exposure to nonionizing radiation: Secondary | ICD-10-CM

## 2022-06-12 DIAGNOSIS — D229 Melanocytic nevi, unspecified: Secondary | ICD-10-CM

## 2022-06-12 DIAGNOSIS — D1800 Hemangioma unspecified site: Secondary | ICD-10-CM

## 2022-06-12 NOTE — Patient Instructions (Addendum)
Recommend Cutemole moisturizer for nails      Gentle Skin Care Guide  1. Bathe no more than once a day.  2. Avoid bathing in hot water  3. Use a mild soap like Dove, Vanicream, Cetaphil, CeraVe. Can use Lever 2000 or Cetaphil antibacterial soap  4. Use soap only where you need it. On most days, use it under your arms, between your legs, and on your feet. Let the water rinse other areas unless visibly dirty.  5. When you get out of the bath/shower, use a towel to gently blot your skin dry, don't rub it.  6. While your skin is still a little damp, apply a moisturizing cream such as Vanicream, CeraVe, Cetaphil, Eucerin, Sarna lotion or plain Vaseline Jelly. For hands apply Neutrogena [place] Hand Cream or Excipial Hand Cream.  7. Reapply moisturizer any time you start to itch or feel dry.  8. Sometimes using free and clear laundry detergents can be helpful. Fabric softener sheets should be avoided. Downy Free & Gentle liquid, or any liquid fabric softener that is free of dyes and perfumes, it acceptable to use  9. If your doctor has given you prescription creams you may apply moisturizers over them     Melanoma ABCDEs  Melanoma is the most dangerous type of skin cancer, and is the leading cause of death from skin disease.  You are more likely to develop melanoma if you: Have light-colored skin, light-colored eyes, or red or blond hair Spend a lot of time in the sun Tan regularly, either outdoors or in a tanning bed Have had blistering sunburns, especially during childhood Have a close family member who has had a melanoma Have atypical moles or large birthmarks  Early detection of melanoma is key since treatment is typically straightforward and cure rates are extremely high if we catch it early.   The first sign of melanoma is often a change in a mole or a new dark spot.  The ABCDE system is a way of remembering the signs of melanoma.  A for asymmetry:  The two halves do not  match. B for border:  The edges of the growth are irregular. C for color:  A mixture of colors are present instead of an even brown color. D for diameter:  Melanomas are usually (but not always) greater than 7m - the size of a pencil eraser. E for evolution:  The spot keeps changing in size, shape, and color.  Please check your skin once per month between visits. You can use a small mirror in front and a large mirror behind you to keep an eye on the back side or your body.   If you see any new or changing lesions before your next follow-up, please call to schedule a visit.  Please continue daily skin protection including broad spectrum sunscreen SPF 30+ to sun-exposed areas, reapplying every 2 hours as needed when you're outdoors.   Staying in the shade or wearing long sleeves, sun glasses (UVA+UVB protection) and wide brim hats (4-inch brim around the entire circumference of the hat) are also recommended for sun protection.    Due to recent changes in healthcare laws, you may see results of your pathology and/or laboratory studies on MyChart before the doctors have had a chance to review them. We understand that in some cases there may be results that are confusing or concerning to you. Please understand that not all results are received at the same time and often the doctors may need  to interpret multiple results in order to provide you with the best plan of care or course of treatment. Therefore, we ask that you please give [place] 2 business days to thoroughly review all your results before contacting the office for clarification. Should we see a critical lab result, you will be contacted sooner.   If You Need Anything After Your Visit  If you have any questions or concerns for your doctor, please call our main line at [phone] and press option 4 to reach your doctor's medical assistant. If no one answers, please leave a voicemail as directed and we will return your call as soon as possible.  Messages left after 4 pm will be answered the following business day.   You may also send [place] a message via [place]. We typically respond to MyChart messages within 1-2 business days.  For prescription refills, please ask your pharmacy to contact our office. Our fax number is [phone].  If you have an urgent issue when the clinic is closed that cannot wait until the next business day, you can page your doctor at the number below.    Please note that while we do our best to be available for urgent issues outside of office hours, we are not available 24/7.   If you have an urgent issue and are unable to reach [place], you may choose to seek medical care at your doctor's office, retail clinic, urgent care center, or emergency room.  If you have a medical emergency, please immediately call 911 or go to the emergency department.  Pager Numbers  - Dr. Nehemiah Massed: [phone]  - Dr. Laurence Ferrari: [phone]  - Dr. Nicole Kindred: [phone]  In the event of inclement weather, please call our main line at [phone] for an update on the status of any delays or closures.  Dermatology Medication Tips: Please keep the boxes that topical medications come in in order to help keep track of the instructions about where and how to use these. Pharmacies typically print the medication instructions only on the boxes and not directly on the medication tubes.   If your medication is too expensive, please contact our office at [phone] option 4 or send [place] a message through [place].   We are unable to tell what your co-pay for medications will be in advance as this is different depending on your insurance coverage. However, we may be able to find a substitute medication at lower cost or fill out paperwork to get insurance to cover a needed medication.   If a prior authorization is required to get your medication covered by your insurance company, please allow [place] 1-2 business days to complete this process.  Drug  prices often vary depending on where the prescription is filled and some pharmacies may offer cheaper prices.  The website www.goodrx.com contains coupons for medications through different pharmacies. The prices here do not account for what the cost may be with help from insurance (it may be cheaper with your insurance), but the website can give you the price if you did not use any insurance.  - You can print the associated coupon and take it with your prescription to the pharmacy.  - You may also stop by our office during regular business hours and pick up a GoodRx coupon card.  - If you need your prescription sent electronically to a different pharmacy, notify our office through [facility] or by phone at [phone] option 4.     Si Usted Necesita Algo Despus de Su  Visita  Tambin puede enviarnos un mensaje a travs de MyChart. Por lo general respondemos a los mensajes de MyChart en el transcurso de 1 a 2 das hbiles.  Para renovar recetas, por favor pida a su farmacia que se ponga en contacto con nuestra oficina. Harland Dingwall de fax es [place] [phone].  Si tiene un asunto urgente cuando la clnica est cerrada y que no puede esperar hasta el siguiente da hbil, puede llamar/localizar a su doctor(a) al nmero que aparece a continuacin.   Por favor, tenga en cuenta que aunque hacemos todo lo posible para estar disponibles para asuntos urgentes fuera del horario de [place], no estamos disponibles las 24 horas del da, los 7 das de la [place].   Si tiene un problema urgente y no puede comunicarse con nosotros, puede optar por buscar atencin mdica  en el consultorio de su doctor(a), en una clnica privada, en un centro de atencin urgente o en una sala de emergencias.  Si tiene Engineering geologist, por favor llame inmediatamente al 911 o vaya a la sala de emergencias.  Nmeros de bper  - Dr. Nehemiah Massed: [phone]  - Dra. Moye: [phone]  - Dra. Nicole Kindred:  [phone]  En caso de inclemencias del [place], por favor llame a Johnsie Kindred principal al [phone] para una actualizacin sobre el [place] de cualquier retraso o cierre.  Consejos para la medicacin en dermatologa: Por favor, guarde las cajas en las que vienen los medicamentos de uso tpico para ayudarle a seguir las instrucciones sobre dnde y cmo usarlos. Las farmacias generalmente imprimen las instrucciones del medicamento slo en las cajas y no directamente en los tubos del [place].   Si su medicamento es muy caro, por favor, pngase en contacto con Zigmund Daniel llamando al [phone] y presione la opcin 4 o envenos un mensaje a travs de Pharmacist, community.   No podemos decirle cul ser su copago por los medicamentos por adelantado ya que esto es diferente dependiendo de la cobertura de su seguro. Sin embargo, es posible que podamos encontrar un medicamento sustituto a Electrical engineer un formulario para que el seguro cubra el medicamento que se considera necesario.   Si se requiere una autorizacin previa para que su compaa de seguros [place] su medicamento, por favor permtanos de 1 a 2 das hbiles para completar este proceso.  Los precios de los medicamentos varan con frecuencia dependiendo del Environmental consultant de dnde se surte la receta y alguna farmacias pueden ofrecer precios ms baratos.  El sitio web www.goodrx.com tiene cupones para medicamentos de Airline pilot. Los precios aqu no tienen en cuenta lo que podra costar con la ayuda del seguro (puede ser ms barato con su seguro), pero el sitio web puede darle el precio si no utiliz Research scientist (physical sciences).  - Puede imprimir el cupn correspondiente y llevarlo con su receta a la farmacia.  - Tambin puede pasar por nuestra oficina durante el horario de atencin regular y Charity fundraiser una tarjeta de cupones de GoodRx.  - Si necesita que su receta se enve electrnicamente a una farmacia diferente, informe a nuestra oficina a travs de  MyChart de [place] o por telfono llamando al [phone] y presione la opcin 4.

## 2022-06-12 NOTE — Progress Notes (Signed)
Follow-Up Visit   Subjective  [name] is a 66 y.o. female who presents for the following: Annual Exam (Tbse , hx of LS et A at left vaginal area, hx of SCC. Reports some dry skin and few spots at face. ). The patient presents for Total-Body Skin Exam (TBSE) for skin cancer screening and mole check.  The patient has spots, moles and lesions to be evaluated, some may be new or changing and the patient has concerns that these could be cancer.  The following portions of the chart were reviewed this encounter and updated as appropriate:  Tobacco  Allergies  Meds  Problems  Med Hx  Surg Hx  Fam Hx     Review of Systems: No other skin or systemic complaints except as noted in HPI or Assessment and Plan.  Objective  Well appearing patient in no apparent distress; mood and affect are within normal limits.  A full examination was performed including scalp, head, eyes, ears, nose, lips, neck, chest, axillae, abdomen, back, buttocks, bilateral upper extremities, bilateral lower extremities, hands, feet, fingers, toes, fingernails, and toenails. All findings within normal limits unless otherwise noted below.   Assessment & Plan  Lichen sclerosus et atrophicus left labia Recently biopsy proven Currently followed by gyn oncologist Dr. Berline Lopes   Continue clobetasol as prescribed by provider.   Chronic and persistent condition with duration or expected duration over one year. Condition is symptomatic / bothersome to patient. Mostly clear today.  Topical steroids (such as triamcinolone, fluocinolone, fluocinonide, mometasone, clobetasol, halobetasol, betamethasone, hydrocortisone) can cause thinning and lightening of the skin if they are used for too long in the same area. Your physician has selected the right strength medicine for your problem and area affected on the body. Please use your medication only as directed by your physician to prevent side effects.   Nail problem b/l thumb  nail Has hx of hang nails at b/l thumb nails Recommend Cutemol moisturizer for nails   Lentigines - Scattered tan macules - Due to sun exposure - Benign-appearing, observe - Recommend daily broad spectrum sunscreen SPF 30+ to sun-exposed areas, reapply every 2 hours as needed. - Call for any changes  Seborrheic Keratoses - Stuck-on, waxy, tan-brown papules and/or plaques  - Benign-appearing - Discussed benign etiology and prognosis. - Observe - Call for any changes  Melanocytic Nevi - Tan-brown and/or pink-flesh-colored symmetric macules and papules - Benign appearing on exam today - Observation - Call clinic for new or changing moles - Recommend daily use of broad spectrum spf 30+ sunscreen to sun-exposed areas.   Xerosis - diffuse xerotic patches - recommend gentle, hydrating skin care - gentle skin care handout given  Hemangiomas - Red papules - Discussed benign nature - Observe - Call for any changes  Actinic Damage - Chronic condition, secondary to cumulative UV/sun exposure - diffuse scaly erythematous macules with underlying dyspigmentation - Recommend daily broad spectrum sunscreen SPF 30+ to sun-exposed areas, reapply every 2 hours as needed.  - Staying in the shade or wearing long sleeves, sun glasses (UVA+UVB protection) and wide brim hats (4-inch brim around the entire circumference of the hat) are also recommended for sun protection.  - Call for new or changing lesions.  History of Squamous Cell Carcinoma of the Skin At left labia majora 09/2021. Possibly transformed from Lichen sclerosus - No evidence of recurrence today - No lymphadenopathy - Recommend regular full body skin exams - Recommend daily broad spectrum sunscreen SPF 30+ to sun-exposed areas,  reapply every 2 hours as needed.  - Call if any new or changing lesions are noted between office visits  Skin cancer screening performed today. Return in about 1 year (around 06/13/2023) for TBSE. IRuthell Rummage, CMA, am acting as scribe for Sarina Ser, MD. Documentation: I have reviewed the above documentation for accuracy and completeness, and I agree with the above.  Sarina Ser, MD

## 2022-07-28 ENCOUNTER — Encounter (INDEPENDENT_AMBULATORY_CARE_PROVIDER_SITE_OTHER): Payer: No Typology Code available for payment source | Admitting: Ophthalmology

## 2022-07-28 DIAGNOSIS — H35373 Puckering of macula, bilateral: Secondary | ICD-10-CM | POA: Diagnosis not present

## 2022-07-28 DIAGNOSIS — H43813 Vitreous degeneration, bilateral: Secondary | ICD-10-CM | POA: Diagnosis not present

## 2022-08-29 ENCOUNTER — Inpatient Hospital Stay: Payer: No Typology Code available for payment source | Attending: Gynecologic Oncology | Admitting: Gynecologic Oncology

## 2022-08-29 DIAGNOSIS — C519 Malignant neoplasm of vulva, unspecified: Secondary | ICD-10-CM

## 2022-08-29 NOTE — Progress Notes (Unsigned)
Gynecologic Oncology Return Clinic Visit  08/29/22  Reason for Visit: surveillance in the setting of early vulvar cancer  Treatment History: 10/2021 Biopsy: Vulvar biopsy revealed small area of SCC in the background of lichen sclerosis.  3 mm area of squamous cell carcinoma, well differentiated, depth of invasion 1.7 mm.  No perineural invasion.  Margins negative for carcinoma.  Lichen sclerosis present at the edge of the specimen.   23/23/33: PET scan negative for metastatic disease.  11/23/2021 Surgery: Left partial radical vulvectomy, left inguinofemoral sentinel lymph node biopsy. Findings: Left anterior, medial labia majora with 1cm area of scar from prior vulvectomy, well healed, with no mass. Area is approximately 3cm lateral to the level of the inferior clitoral hood. No other lesions on the vulva. No palpable inguinofemoral lymphadenopathy. On sentinel lymph node evaluation, left inguinofemoral lymph node identified, green and hot.  Pathology: Inguinal sentinel lymph node negative for metastatic cancer.  Left vulvar incision with benign skin with lichen sclerosus but no residual squamous cell carcinoma.  Margins all negative.  11/30/2021 Tumor Board  Stage: 1B vulvar squamous cell carcinoma with no residual disease on partial radical vulvectomy, prior vulvar biopsy with invasive squamous cell carcinoma, grade 1, depth of invasion 1.7 mm, 3 mm horizontal extent Plan: Observation.  Vulvar biopsy 7/85/88: Lichen sclerosus, no dysplasia or malignancy.  Interval History: ***  Past Medical/Surgical History: Past Medical History:  Diagnosis Date   Diverticulitis    Environmental allergies    GERD (gastroesophageal reflux disease)    IBS (irritable bowel syndrome)    Squamous cell carcinoma of skin 09/15/2021   L labia majora with lichen sclerosus - needs referral    Past Surgical History:  Procedure Laterality Date   ABDOMINAL HYSTERECTOMY     BLADDER SURGERY      CHOLECYSTECTOMY     FRACTURE SURGERY Left    ankle surgery   JOINT REPLACEMENT     TONSILLECTOMY      Family History  Problem Relation Age of Onset   Lung cancer Maternal Aunt     Social History   Socioeconomic History   Marital status: Single    Spouse name: Not on file   Number of children: Not on file   Years of education: Not on file   Highest education level: Not on file  Occupational History   Not on file  Tobacco Use   Smoking status: Every Day    Packs/day: 0.50    Types: Cigarettes   Smokeless tobacco: Never  Vaping Use   Vaping Use: Never used  Substance and Sexual Activity   Alcohol use: Yes    Comment: Occ   Drug use: No   Sexual activity: Not Currently  Other Topics Concern   Not on file  Social History Narrative   Not on file   Social Determinants of Health   Financial Resource Strain: Not on file  Food Insecurity: Not on file  Transportation Needs: Not on file  Physical Activity: Not on file  Stress: Not on file  Social Connections: Not on file    Current Medications:  Current Outpatient Medications:    acetaminophen (TYLENOL) 500 MG tablet, Take 1,000 mg by mouth 3 (three) times daily as needed. For pain, Disp: , Rfl:    amitriptyline (ELAVIL) 150 MG tablet, Take 150 mg by mouth Nightly., Disp: , Rfl:    citalopram (CELEXA) 40 MG tablet, Take 40 mg by mouth daily.  , Disp: , Rfl:    clobetasol ointment (TEMOVATE)  0.86 %, Apply 1 Application topically 3 (three) times a week. Use small amount on vulva 3 times a week at night, Disp: 30 g, Rfl: 2   clonazePAM (KLONOPIN) 0.5 MG tablet, Take 0.5 mg by mouth 2 (two) times daily. , Disp: , Rfl:    escitalopram (LEXAPRO) 20 MG tablet, Take 30 mg by mouth daily., Disp: , Rfl:    linaclotide (LINZESS) 290 MCG CAPS capsule, Take 1 capsule by mouth daily., Disp: , Rfl:    montelukast (SINGULAIR) 10 MG tablet, Take 10 mg by mouth Nightly., Disp: , Rfl:    pantoprazole (PROTONIX) 40 MG tablet, Take 40 mg  by mouth 2 (two) times daily., Disp: , Rfl:    QUEtiapine Fumarate (SEROQUEL XR) 150 MG 24 hr tablet, Take by mouth., Disp: , Rfl:    sertraline (ZOLOFT) 100 MG tablet, Take 1 tablet by mouth daily., Disp: , Rfl:    simvastatin (ZOCOR) 20 MG tablet, Take 20 mg by mouth at bedtime.  , Disp: , Rfl:    tiZANidine (ZANAFLEX) 2 MG tablet, Take 2 mg by mouth every 6 (six) hours as needed., Disp: , Rfl:    traMADol (ULTRAM) 50 MG tablet, Take 1 tablet (50 mg total) by mouth every 6 (six) hours as needed., Disp: 15 tablet, Rfl: 0  Review of Systems: Denies appetite changes, fevers, chills, fatigue, unexplained weight changes. Denies hearing loss, neck lumps or masses, mouth sores, ringing in ears or voice changes. Denies cough or wheezing.  Denies shortness of breath. Denies chest pain or palpitations. Denies leg swelling. Denies abdominal distention, pain, blood in stools, constipation, diarrhea, nausea, vomiting, or early satiety. Denies pain with intercourse, dysuria, frequency, hematuria or incontinence. Denies hot flashes, pelvic pain, vaginal bleeding or vaginal discharge.   Denies joint pain, back pain or muscle pain/cramps. Denies itching, rash, or wounds. Denies dizziness, headaches, numbness or seizures. Denies swollen lymph nodes or glands, denies easy bruising or bleeding. Denies anxiety, depression, confusion, or decreased concentration.  Physical Exam: There were no vitals taken for this visit. General: ***Alert, oriented, no acute distress. HEENT: ***Posterior oropharynx clear, sclera anicteric. Chest: ***Clear to auscultation bilaterally.  ***Port site clean. Cardiovascular: ***Regular rate and rhythm, no murmurs. Abdomen: ***Obese, soft, nontender.  Normoactive bowel sounds.  No masses or hepatosplenomegaly appreciated.  ***Well-healed scar. Extremities: ***Grossly normal range of motion.  Warm, well perfused.  No edema bilaterally. Skin: ***No rashes or lesions  noted. Lymphatics: ***No cervical, supraclavicular, or inguinal adenopathy. GU: Normal appearing external genitalia without erythema, excoriation, or lesions.  Speculum exam reveals ***.  Bimanual exam reveals ***.  ***Rectovaginal exam  confirms ___.  Laboratory & Radiologic Studies: ***  Assessment & Plan: [name] is a 66 y.o. woman with Stage IB well-differentiated squamous cell carcinoma arising in the setting of lichen sclerosis who presents for surveillance visit.   Patient is doing well.  Based on exam findings today, biopsy was recommended.  This was performed today without difficulty.  I will call the patient back with these results.   I suspect that her biopsy will reveal lichen sclerosis.  She had run out of her prescription of clobetasol.  I have sent a new prescription for this to her pharmacy.  We discussed using this 2-3 times a week in the evening when she has symptoms.  We will tentatively plan for a follow-up visit in 3 months.  We reviewed signs and symptoms that should prompt a phone call before her neck scheduled visit.  ***  minutes of total time was spent for this patient encounter, including preparation, face-to-face counseling with the patient and coordination of care, and documentation of the encounter.  Jeral Pinch, MD  Division of Gynecologic Oncology  Department of Obstetrics and Gynecology  [facility]

## 2022-11-06 ENCOUNTER — Emergency Department
Admission: EM | Admit: 2022-11-06 | Discharge: 2022-11-06 | Disposition: A | Discharge status: Home / Self Care | Payer: No Typology Code available for payment source | Attending: Emergency Medicine | Admitting: Emergency Medicine

## 2022-11-06 DIAGNOSIS — F419 Anxiety disorder, unspecified: Secondary | ICD-10-CM | POA: Insufficient documentation

## 2022-11-06 MED ORDER — LORAZEPAM 1 MG PO TABS
1.0000 mg | ORAL_TABLET | Freq: Once | ORAL | Status: AC
  Administered 2022-11-06: 1 mg via ORAL
  Filled 2022-11-06: qty 1

## 2022-11-06 MED ORDER — HYDROXYZINE HCL 10 MG PO TABS: 10.0000 mg | ORAL_TABLET | Freq: Three times a day (TID) | ORAL | 0 refills | Status: DC | PRN

## 2022-11-06 NOTE — ED Provider Notes (Signed)
[facility] Provider Note    Event Date/Time   First MD Initiated Contact with Patient 11/06/22 1446     (approximate)   History   Anxiety   HPI  [name] is a 67 y.o. female past medical history of anxiety who presents to the emergency department with anxiety and restlessness.  Endorses 3 days of feeling very restless and anxious.  Nothing improves or worsens.  Denies any suicidal or homicidal ideation.  Denies any abdominal pain, nausea or vomiting.  States that she has been compliant with her medications.     Physical Exam   Triage Vital Signs: ED Triage Vitals  Enc Vitals Group     BP 11/06/22 1130 107/70     Pulse Rate 11/06/22 1130 92     Resp 11/06/22 1130 18     Temp 11/06/22 1130 98.7 F (37.1 C)     Temp Source 11/06/22 1130 Oral     SpO2 11/06/22 1130 95 %     Weight 11/06/22 1132 95 lb 7.4 oz (43.3 kg)     Height 11/06/22 1131 '5\' 3"'$  (1.6 m)     Head Circumference --      Peak Flow --      Pain Score 11/06/22 1130 0     Pain Loc --      Pain Edu? --      Excl. in [place]? --     Most recent vital signs: Vitals:   11/06/22 1130  BP: 107/70  Pulse: 92  Resp: 18  Temp: 98.7 F (37.1 C)  SpO2: 95%    Physical Exam Constitutional:      Appearance: She is well-developed.  HENT:     Head: Atraumatic.  Eyes:     Conjunctiva/sclera: Conjunctivae normal.  Cardiovascular:     Rate and Rhythm: Regular rhythm.  Pulmonary:     Effort: No respiratory distress.  Abdominal:     General: There is no distension.  Musculoskeletal:        General: Normal range of motion.     Cervical back: Normal range of motion.  Skin:    General: Skin is warm.  Neurological:     Mental Status: She is alert. Mental status is at baseline.  Psychiatric:        Mood and Affect: Mood is anxious.        Speech: Speech normal.        Thought Content: Thought content normal.     Comments: Pacing in the room      IMPRESSION / MDM / [place] / ED COURSE  I reviewed the triage vital signs and the nursing notes.  Clinical picture concerning for anxiety.  Patient given p.o. Ativan.  On chart review patient does have a history of long-term use of Seroquel, have a lower suspicion for tardive dyskinesia.  Denies any suicidal or homicidal ideation.  Discussed anxiety with the patient.  States that she will follow-up with her primary care provider.  Will do a short course of Atarax.  No chest pain or shortness of breath have a low suspicion for ACS.   Labs (all labs ordered are listed, but only abnormal results are displayed) Labs interpreted as -    Labs Reviewed - No data to display       PROCEDURES:  Critical Care performed: No  Procedures acute illness / injury with system symptoms. Patient's presentation is most consistent with exacerbation of chronic illness.  MEDICATIONS ORDERED IN ED: Medications  LORazepam (ATIVAN) tablet 1 mg (1 mg Oral Given 11/06/22 1610)    FINAL CLINICAL IMPRESSION(S) / ED DIAGNOSES   Final diagnoses:  Anxiety     Rx / DC Orders   ED Discharge Orders          Ordered    hydrOXYzine (ATARAX) 10 MG tablet  3 times daily PRN        11/06/22 1626             Note:  This document was prepared using Dragon voice recognition software and may include unintentional dictation errors.   Nathaniel Man, MD 11/06/22 [id number]

## 2022-11-06 NOTE — ED Triage Notes (Signed)
Pt presents to the ED via POV due to anxiety. Pt states she is having a panic attack and can't stop rocking. Pt states this has been going on for a couple days and getting worse. NAD. Pt A&Ox4

## 2022-11-10 ENCOUNTER — Telehealth: Payer: Self-pay

## 2022-11-10 NOTE — Telephone Encounter (Signed)
Ms. Gasner called office stating she needs to schedule an appointment with Dr.Tucker. She states she has found another spot on her labia. She states it is on the right side it has been there for a few days. She can't see it but feels it when she wipes. It itches and hurts. No bleeding. When asked what size it was she states she doesn't know but smaller than a dime.   Pt was last seen in October by Berline Lopes.  Berline Lopes has no opening until late February, pt is aware she may need to see other providers in the office. She states she prefers [facility].  Dr.Tucker notified and advise on when she can be seen and by whom.

## 2022-11-13 NOTE — Telephone Encounter (Signed)
Sounds good - Please make her a follow-up with me 3 months after her upcoming appt with Dr. Sherlon Handing

## 2022-11-13 NOTE — Telephone Encounter (Signed)
Patient scheduled for a three month follow up after Dr Delsa Sale. Appt for 4/25 at 1 pm; [facility] for the patient with the appt date/time

## 2022-11-15 ENCOUNTER — Emergency Department (HOSPITAL_COMMUNITY)
Admission: EM | Admit: 2022-11-15 | Discharge: 2022-11-15 | Disposition: A | Discharge status: Home / Self Care | Payer: No Typology Code available for payment source | Attending: Student | Admitting: Student

## 2022-11-15 ENCOUNTER — Encounter (HOSPITAL_COMMUNITY): Payer: Self-pay

## 2022-11-15 DIAGNOSIS — R259 Unspecified abnormal involuntary movements: Secondary | ICD-10-CM

## 2022-11-15 DIAGNOSIS — R251 Tremor, unspecified: Secondary | ICD-10-CM | POA: Insufficient documentation

## 2022-11-15 DIAGNOSIS — R252 Cramp and spasm: Secondary | ICD-10-CM | POA: Diagnosis present

## 2022-11-15 MED ORDER — DIPHENHYDRAMINE HCL 25 MG PO CAPS
50.0000 mg | ORAL_CAPSULE | Freq: Once | ORAL | Status: AC
  Administered 2022-11-15: 50 mg via ORAL
  Filled 2022-11-15: qty 2

## 2022-11-15 MED ORDER — LORAZEPAM 0.5 MG PO TABS
0.5000 mg | ORAL_TABLET | Freq: Once | ORAL | Status: AC
Start: 2022-11-15 — End: 2022-11-15
  Administered 2022-11-15: 0.5 mg via ORAL
  Filled 2022-11-15: qty 1

## 2022-11-15 NOTE — ED Provider Notes (Signed)
[place] DEPT Provider Note   CSN: [id number] Arrival date & time: 11/15/22  0849     History  Chief Complaint  Patient presents with   Spasms    [name] is a 67 y.o. female.  Patient presents to the emergency department today for muscle spasms that she states has been ongoing on for weeks.  She states that they are uncontrollable.  Patient was seen at [facility] ED on 1/1 for anxiety.  That note stated that patient was "pacing in the room".  Low concern for dystonia at that time.  Patient was given Ativan and a prescription for hydroxyzine.  Patient states that her symptoms have not improved despite treatment.  She has been on Seroquel for some time.  She is able to ambulate.  She denies other systemic problems other than "feeling weak".  She is upset because she does not feel that she can work with her current movements.  She does have a history of squamous cell carcinoma.        Home Medications Prior to Admission medications   Medication Sig Start Date End Date Taking? Authorizing Provider  acetaminophen (TYLENOL) 500 MG tablet Take 1,000 mg by mouth 3 (three) times daily as needed. For pain    [provider]  amitriptyline (ELAVIL) 150 MG tablet Take 150 mg by mouth Nightly. 12/12/18   [provider]  citalopram (CELEXA) 40 MG tablet Take 40 mg by mouth daily.      [provider]  clobetasol ointment (TEMOVATE) 1.94 % Apply 1 Application topically 3 (three) times a week. Use small amount on vulva 3 times a week at night 05/29/22   Lafonda Mosses, MD  clonazePAM (KLONOPIN) 0.5 MG tablet Take 0.5 mg by mouth 2 (two) times daily.     [provider]  escitalopram (LEXAPRO) 20 MG tablet Take 30 mg by mouth daily. 06/11/17   [provider]  hydrOXYzine (ATARAX) 10 MG tablet Take 1 tablet (10 mg total) by mouth 3 (three) times daily as needed for anxiety. 11/06/22   Nathaniel Man, MD   linaclotide (LINZESS) 290 MCG CAPS capsule Take 1 capsule by mouth daily. 02/28/21   [provider]  montelukast (SINGULAIR) 10 MG tablet Take 10 mg by mouth Nightly. 06/11/17   [provider]  pantoprazole (PROTONIX) 40 MG tablet Take 40 mg by mouth 2 (two) times daily. 06/11/17   [provider]  QUEtiapine Fumarate (SEROQUEL XR) 150 MG 24 hr tablet Take by mouth. 02/15/21   [provider]  sertraline (ZOLOFT) 100 MG tablet Take 1 tablet by mouth daily. 02/15/21   [provider]  simvastatin (ZOCOR) 20 MG tablet Take 20 mg by mouth at bedtime.      [provider]  tiZANidine (ZANAFLEX) 2 MG tablet Take 2 mg by mouth every 6 (six) hours as needed. 09/09/21   [provider]  traMADol (ULTRAM) 50 MG tablet Take 1 tablet (50 mg total) by mouth every 6 (six) hours as needed. 11/15/21   Fisher, Linden Dolin, PA-C      Allergies    Penicillins, Reglan [metoclopramide], Sulfonamide derivatives, and Meperidine hcl    Review of Systems   Review of Systems  Physical Exam Updated Vital Signs BP 125/77 (BP Location: Left Arm)   Pulse 100   Temp 98.2 F (36.8 C) (Oral)   Resp 18   SpO2 98%  Physical Exam Vitals and nursing note reviewed.  Constitutional:  General: She is not in acute distress.    Appearance: She is well-developed.  HENT:     Head: Normocephalic and atraumatic.     Right Ear: External ear normal.     Left Ear: External ear normal.     Nose: Nose normal.  Eyes:     Conjunctiva/sclera: Conjunctivae normal.  Cardiovascular:     Rate and Rhythm: Normal rate and regular rhythm.     Heart sounds: No murmur heard. Pulmonary:     Effort: No respiratory distress.     Breath sounds: No wheezing, rhonchi or rales.  Abdominal:     Palpations: Abdomen is soft.     Tenderness: There is no abdominal tenderness. There is no guarding or rebound.  Musculoskeletal:     Cervical back: Normal range of motion and neck supple.      Right lower leg: No edema.     Left lower leg: No edema.  Skin:    General: Skin is warm and dry.     Findings: No rash.  Neurological:     General: No focal deficit present.     Mental Status: She is alert. Mental status is at baseline.     Cranial Nerves: Cranial nerves 2-12 are intact. No dysarthria.     Sensory: Sensation is intact.     Motor: Tremor present. No weakness.     Gait: Gait normal.     Comments: Patient with irregular body movements while sitting in the chair.  When she stands up and walks for me, these resolve and she seems to walk normally until she stands and starts talking at which point, irregular body movements resume.  No ataxia.  Psychiatric:        Mood and Affect: Mood normal.     ED Results / Procedures / Treatments   Labs (all labs ordered are listed, but only abnormal results are displayed) Labs Reviewed - No data to display  EKG None  Radiology No results found.  Procedures Procedures    Medications Ordered in ED Medications  LORazepam (ATIVAN) tablet 0.5 mg (has no administration in time range)  diphenhydrAMINE (BENADRYL) capsule 50 mg (has no administration in time range)    ED Course/ Medical Decision Making/ A&P    Patient seen and examined. History obtained directly from patient.  Reviewed external ED visit.  Reviewed medication list.  Labs/EKG: None ordered.  Considered CBC, BMP, however do not feel that these will be likely to eliminate the patient's current condition.  Imaging: None ordered.  No strokelike symptoms and low concern for seizure.  Head CT considered, but not felt that at this would be beneficial.  Medications/Fluids: Ordered: P.o. Ativan, p.o. Benadryl.  Most recent vital signs reviewed and are as follows: BP 125/77 (BP Location: Left Arm)   Pulse 100   Temp 98.2 F (36.8 C) (Oral)   Resp 18   SpO2 98%   Initial impression: Body movements, no true muscle spasm.  Symptoms are not typical for dystonia,  parkinsonism, seizure.  10:28 AM Reassessment performed. Patient appears stable.  She is standing in the room, talking on the phone without any difficulty.  No balance issues.  Mild gentle swaying noted.  Most current vital signs reviewed and are as follows: BP 125/77 (BP Location: Left Arm)   Pulse 100   Temp 98.2 F (36.8 C) (Oral)   Resp 18   SpO2 98%   Plan: Discharge to home.   Prescriptions written for: None  Other  home care instructions discussed: Use of hydroxyzine/diphenhydramine  ED return instructions discussed: Worsening or changing symptoms  Follow-up instructions discussed: Patient encouraged to follow-up with their PCP in 3 days to discuss potential for medication side effect or additional treatment for anxiety.                          Medical Decision Making Risk Prescription drug management.   Patient here for body movements.  These appear to be irregular.  She has no ataxia and is ambulating well.  Symptoms resolved when she initially stood up and walked for me.  Overall low concern for dystonic reaction.  Seems somewhat volitional, for example when sitting, involves arms to some extent, however she is able to stand and hold her phone without difficulty at other times.  Does not appear to be chorea.   Patient without any homicidal or suicidal ideations.  She does seem somewhat distressed because her movements are making it difficult for her to work.  She was treated with Ativan and hydroxyzine here.  Symptoms stable.  Encouraged outpatient follow-up.  There is indication for further medical workup at this time.  Do not feel that there is indication for emergent psychiatric evaluation.  The patient's vital signs, pertinent lab work and imaging were reviewed and interpreted as discussed in the ED course. Hospitalization was considered for further testing, treatments, or serial exams/observation. However as patient is well-appearing, has a stable exam, and reassuring  studies today, I do not feel that they warrant admission at this time. This plan was discussed with the patient who verbalizes agreement and comfort with this plan and seems reliable and able to return to the Emergency Department with worsening or changing symptoms.          Final Clinical Impression(s) / ED Diagnoses Final diagnoses:  Abnormal movement    Rx / DC Orders ED Discharge Orders     None         Carlisle Cater, PA-C 11/15/22 1031    Kommor, Er[place]anger, MD 11/15/22 2137

## 2022-11-15 NOTE — Discharge Instructions (Signed)
Please call your doctor to obtain follow-up.  Please have a discussion with them in regards to whether or not some of your medications could be causing your symptoms.  Please continue either the hydroxyzine prescribed at your visit from [place] or continue taking 25 mg of Benadryl up to every 6 hours for your symptoms.  Return with worsening or other concerns.

## 2022-11-15 NOTE — ED Triage Notes (Signed)
Pt presents with c/o muscle spasms. Pt reports this has been going on for a while and she just has uncontrolled "jerky" movements. Pt appears anxious in triage.

## 2022-11-28 NOTE — Assessment & Plan Note (Addendum)
[name] is a 67 y.o. woman with Stage IB well-differentiated squamous cell carcinoma of the vulva arising in the setting of lichen sclerosis who presents for surveillance visit.   Patient is doing well.  We reviewed signs and symptoms that should prompt a phone call before her neck scheduled visit Follow-up in 6 mos

## 2022-11-28 NOTE — Progress Notes (Signed)
Follow Up Note: Gyn-Onc  [name] 67 y.o. female  CC: She presents for a follow-up visit.     HPI: The oncology history was reviewed.  Interval History: The histology from the bx performed at the last visit showed vulva LS.  She denies any new lesions, itching, leg/groin pain, urinary symptoms, leg swelling, weight loss or cough.     Review of Systems  Review of Systems  Constitutional:  Negative for malaise/fatigue and weight loss.  Respiratory:  Negative for shortness of breath and wheezing.   Cardiovascular:  Negative for chest pain and leg swelling.  Gastrointestinal:  Negative for abdominal pain, blood in stool, constipation, nausea and vomiting.  Genitourinary:  Negative for dysuria, frequency, hematuria and urgency.  Musculoskeletal:  Negative for joint pain and myalgias.  Neurological:  Negative for weakness.  Psychiatric/Behavioral:  Negative for depression. The patient does not have insomnia.    Current medications, allergy, social history, past surgical history, past medical history, family history were all reviewed.    Vitals:  There were no vitals taken for this visit.  Physical Exam:  Physical Exam Exam conducted with a chaperone present.  Constitutional:      General: She is not in acute distress. Cardiovascular:     Rate and Rhythm: Normal rate and regular rhythm.  Pulmonary:     Effort: Pulmonary effort is normal.     Breath sounds: Normal breath sounds. No wheezing or rhonchi.  Abdominal:     Palpations: Abdomen is soft.     Tenderness: There is no abdominal tenderness. There is no right CVA tenderness or left CVA tenderness.     Hernia: No hernia is present.  Genitourinary:    General: Normal vulva.     Urethra: No urethral lesion.     Vagina: No lesions. No bleeding Musculoskeletal:     Cervical back: Neck supple.     Right lower leg: No edema.     Left lower leg: No edema.  Lymphadenopathy:     Upper Body:     Right upper body: No  supraclavicular adenopathy.     Left upper body: No supraclavicular adenopathy.     Lower Body: No right inguinal adenopathy. No left inguinal adenopathy.  Skin:    Findings: No rash.  Neurological:     Mental Status: She is oriented to person, place, and time.   Assessment/Plan: ***   Lahoma Crocker, MD

## 2022-11-29 ENCOUNTER — Inpatient Hospital Stay
Payer: No Typology Code available for payment source | Attending: Obstetrics & Gynecology | Admitting: Obstetrics & Gynecology

## 2022-11-29 ENCOUNTER — Encounter: Payer: Self-pay | Admitting: Obstetrics & Gynecology

## 2022-11-29 VITALS — BP 135/72 | HR 87 | Temp 98.2°F | Resp 20 | Wt 97.2 lb

## 2022-11-29 DIAGNOSIS — Z8544 Personal history of malignant neoplasm of other female genital organs: Secondary | ICD-10-CM | POA: Insufficient documentation

## 2022-11-29 DIAGNOSIS — C519 Malignant neoplasm of vulva, unspecified: Secondary | ICD-10-CM

## 2022-11-29 NOTE — Patient Instructions (Signed)
Return in 6 months

## 2023-02-08 ENCOUNTER — Other Ambulatory Visit: Payer: Self-pay | Admitting: Family Medicine

## 2023-02-08 DIAGNOSIS — R109 Unspecified abdominal pain: Secondary | ICD-10-CM

## 2023-02-09 ENCOUNTER — Ambulatory Visit
Admission: RE | Admit: 2023-02-09 | Discharge: 2023-02-09 | Disposition: A | Discharge status: Home / Self Care | Payer: No Typology Code available for payment source | Source: Ambulatory Visit | Attending: Family Medicine | Admitting: Family Medicine

## 2023-02-09 DIAGNOSIS — R109 Unspecified abdominal pain: Secondary | ICD-10-CM | POA: Diagnosis present

## 2023-02-09 MED ORDER — IOHEXOL 300 MG/ML  SOLN
75.0000 mL | Freq: Once | INTRAMUSCULAR | Status: AC | PRN
  Administered 2023-02-09: 75 mL via INTRAVENOUS

## 2023-02-28 NOTE — Progress Notes (Unsigned)
The patient did not show for her scheduled appointment today.

## 2023-03-01 ENCOUNTER — Inpatient Hospital Stay: Payer: No Typology Code available for payment source | Attending: Gynecologic Oncology | Admitting: Gynecologic Oncology

## 2023-03-01 DIAGNOSIS — C519 Malignant neoplasm of vulva, unspecified: Secondary | ICD-10-CM

## 2023-06-03 ENCOUNTER — Emergency Department
Admission: EM | Admit: 2023-06-03 | Discharge: 2023-06-03 | Disposition: A | Discharge status: Home / Self Care | Payer: No Typology Code available for payment source | Attending: Emergency Medicine | Admitting: Emergency Medicine

## 2023-06-03 ENCOUNTER — Other Ambulatory Visit: Payer: Self-pay

## 2023-06-03 DIAGNOSIS — F419 Anxiety disorder, unspecified: Secondary | ICD-10-CM

## 2023-06-03 DIAGNOSIS — R350 Frequency of micturition: Secondary | ICD-10-CM | POA: Diagnosis present

## 2023-06-03 LAB — CBC
HCT: 41.2 % (ref 36.0–46.0)
Hemoglobin: 13.5 g/dL (ref 12.0–15.0)
MCH: 30.9 pg (ref 26.0–34.0)
MCHC: 32.8 g/dL (ref 30.0–36.0)
MCV: 94.3 fL (ref 80.0–100.0)
Platelets: 156 10*3/uL (ref 150–400)
RBC: 4.37 MIL/uL (ref 3.87–5.11)
RDW: 13.3 % (ref 11.5–15.5)
WBC: 5.5 10*3/uL (ref 4.0–10.5)
nRBC: 0 % (ref 0.0–0.2)

## 2023-06-03 LAB — BASIC METABOLIC PANEL
Anion gap: 8 (ref 5–15)
BUN: 20 mg/dL (ref 8–23)
CO2: 26 mmol/L (ref 22–32)
Calcium: 8.9 mg/dL (ref 8.9–10.3)
Chloride: 103 mmol/L (ref 98–111)
Creatinine, Ser: 1.07 mg/dL — ABNORMAL HIGH (ref 0.44–1.00)
GFR, Estimated: 57 mL/min — ABNORMAL LOW (ref >60)
Glucose, Bld: 109 mg/dL — ABNORMAL HIGH (ref 70–99)
Potassium: 4.5 mmol/L (ref 3.5–5.1)
Sodium: 137 mmol/L (ref 135–145)

## 2023-06-03 LAB — URINALYSIS, COMPLETE (UACMP) WITH MICROSCOPIC
Bacteria, UA: NONE SEEN
Bilirubin Urine: NEGATIVE
Glucose, UA: NEGATIVE mg/dL
Hgb urine dipstick: NEGATIVE
Ketones, ur: NEGATIVE mg/dL
Leukocytes,Ua: NEGATIVE
Nitrite: NEGATIVE
Protein, ur: NEGATIVE mg/dL
Specific Gravity, Urine: 1.003 — ABNORMAL LOW (ref 1.005–1.030)
WBC, UA: NONE SEEN WBC/hpf (ref 0–5)
pH: 8 (ref 5.0–8.0)

## 2023-06-03 MED ORDER — SODIUM CHLORIDE 0.9 % IV BOLUS
1000.0000 mL | Freq: Once | INTRAVENOUS | Status: AC
  Administered 2023-06-03: 1000 mL via INTRAVENOUS

## 2023-06-03 MED ORDER — LORAZEPAM 1 MG PO TABS: 1.0000 mg | ORAL_TABLET | Freq: Three times a day (TID) | ORAL | 0 refills | Status: AC | PRN

## 2023-06-03 MED ORDER — KETOROLAC TROMETHAMINE 30 MG/ML IJ SOLN
15.0000 mg | Freq: Once | INTRAMUSCULAR | Status: AC
  Administered 2023-06-03: 15 mg via INTRAVENOUS
  Filled 2023-06-03: qty 1

## 2023-06-03 NOTE — Discharge Instructions (Signed)
As we discussed please follow-up with your doctor tomorrow for recheck/reevaluation and to discuss your ongoing anxiety.  You have been prescribed a short course of Ativan to be used for anxiety.  Do not drink alcohol and do not drive while taking this medication.  Do not take this medication if you are going to be using clonazepam.  You may only use 1 or the other.  Return to the emergency department for any symptom personally concerning to yourself.

## 2023-06-03 NOTE — ED Triage Notes (Signed)
Patient to ED via POV for possible UTI- states burning with urination. Patient states she "is freaking out and can't stop rocking." Patient crying.

## 2023-06-03 NOTE — ED Provider Notes (Signed)
[facility] Provider Note    Event Date/Time   First MD Initiated Contact with Patient 06/03/23 0935     (approximate)  History   Chief Complaint: Urinary Frequency  HPI  [name] is a 67 y.o. female with a past medical history of gastric reflux, anxiety, presents to the emergency department for anxiety and urinary frequency.  According to the patient over the last few days she has been urinating much more frequently than normal although denies any burning or discomfort with urination.  Patient is rocking back and forth states she is very anxious.  Patient states she is scared that she is going to lose her job because she cannot perform at her job due to her anxiety.  States she follows up with her doctor who prescribes her clonazepam but she states this is not working.  Patient is very anxious appearing.  However states she does not have anyone that could pick her up and she drove today.  Physical Exam   Triage Vital Signs: ED Triage Vitals  Encounter Vitals Group     BP 06/03/23 0931 132/67     Systolic BP Percentile --      Diastolic BP Percentile --      Pulse Rate 06/03/23 0931 94     Resp 06/03/23 0931 18     Temp 06/03/23 0931 98 F (36.7 C)     Temp Source 06/03/23 0931 Oral     SpO2 06/03/23 0931 98 %     Weight 06/03/23 0930 99 lb (44.9 kg)     Height 06/03/23 0930 5\' 3"  (1.6 m)     Head Circumference --      Peak Flow --      Pain Score 06/03/23 0930 8     Pain Loc --      Pain Education --      Exclude from Growth Chart --     Most recent vital signs: Vitals:   06/03/23 0931  BP: 132/67  Pulse: 94  Resp: 18  Temp: 98 F (36.7 C)  SpO2: 98%    General: Awake, no distress.  CV:  Good peripheral perfusion.  Regular rate and rhythm  Resp:  Normal effort.  Equal breath sounds bilaterally.  Abd:  No distention.  Soft, nontender.  No rebound or guarding.  ED Results / Procedures / Treatments   MEDICATIONS ORDERED IN  ED: Medications  ketorolac (TORADOL) 30 MG/ML injection 15 mg (has no administration in time range)  sodium chloride 0.9 % bolus 1,000 mL (has no administration in time range)     IMPRESSION / MDM / ASSESSMENT AND PLAN / ED COURSE  I reviewed the triage vital signs and the nursing notes.  Patient's presentation is most consistent with acute presentation with potential threat to life or bodily function.  Patient presents emergency department for anxiety/panic as well as urinary frequency.  Patient is rocking back and forth moaning at times, appears very anxious.  States her anxiety has been worsening significantly recently.  Patient takes clonazepam but she states is not working.  I offered to have psychiatry evaluate but the patient does not wish to speak to a psychiatrist currently.  I offered to give the patient something for anxiety if she could obtain a ride home but she is not able to do so.  Given the patient's urinary frequency we will check labs we will IV hydrate we will obtain a urine sample.  We will continue to  closely monitor.  If the patient is able to obtain a ride home we will dose anxiolytic medication, otherwise I believe it be reasonable to discharge the patient home with a few tablets of Ativan until the patient can see her PCP Monday or Tuesday.  Patient's workup is reassuring, urinalysis is normal.  CBC and chemistry showed no concerning finding.  No sign of any urinary tract infection on exam.  Patient was unable to find a ride home.  Will prescribe a short course of Ativan for the patient to use.  She will follow-up with her doctor tomorrow.  FINAL CLINICAL IMPRESSION(S) / ED DIAGNOSES   Anxiety Urinary frequency  Note:  This document was prepared using Dragon voice recognition software and may include unintentional dictation errors.   [name], MD 06/03/23 1118

## 2023-06-11 ENCOUNTER — Other Ambulatory Visit: Payer: Self-pay

## 2023-06-11 ENCOUNTER — Encounter: Payer: Self-pay | Admitting: Emergency Medicine

## 2023-06-11 ENCOUNTER — Emergency Department
Admission: EM | Admit: 2023-06-11 | Discharge: 2023-06-11 | Disposition: A | Discharge status: Home / Self Care | Payer: No Typology Code available for payment source | Attending: Student in an Organized Health Care Education/Training Program | Admitting: Student in an Organized Health Care Education/Training Program

## 2023-06-11 DIAGNOSIS — K5641 Fecal impaction: Secondary | ICD-10-CM | POA: Diagnosis not present

## 2023-06-11 DIAGNOSIS — Z8544 Personal history of malignant neoplasm of other female genital organs: Secondary | ICD-10-CM | POA: Diagnosis not present

## 2023-06-11 DIAGNOSIS — K5900 Constipation, unspecified: Secondary | ICD-10-CM | POA: Diagnosis present

## 2023-06-11 DIAGNOSIS — F419 Anxiety disorder, unspecified: Secondary | ICD-10-CM | POA: Diagnosis not present

## 2023-06-11 MED ORDER — FLEET ENEMA 7-19 GM/118ML RE ENEM
1.0000 | ENEMA | Freq: Once | RECTAL | Status: AC
  Administered 2023-06-11: 1 via RECTAL

## 2023-06-11 MED ORDER — GLYCERIN (ADULT) 2 G RE SUPP: 1.0000 | Freq: Every day | RECTAL | 0 refills | Status: DC | PRN

## 2023-06-11 NOTE — ED Provider Notes (Signed)
[facility] Provider Note    Event Date/Time   First MD Initiated Contact with Patient 06/11/23 [phone]     (approximate)   History   Constipation   HPI  [name] is a 67 y.o. female who presents today with chief complaint of constipation and request for manual disimpaction.  She reports that it feels stuck.  She has been passing only small pebbles of stool.  She has not had any blood that she has noticed.  She has tried laxatives at home without significant improvement of her symptoms.  No abdominal distention.  No nausea or vomiting.  Patient Active Problem List   Diagnosis Date Noted   Vulvar cancer (HCC) 02/17/2022   Lichen sclerosus 02/17/2022   KNEE PAIN, RIGHT 10/10/2010   CHANGE IN BOWELS 01/04/2010   ABDOMINAL PAIN, UPPER 01/04/2010          Physical Exam   Triage Vital Signs: ED Triage Vitals  Encounter Vitals Group     BP 06/11/23 0930 (!) 129/114     Systolic BP Percentile --      Diastolic BP Percentile --      Pulse Rate 06/11/23 0930 98     Resp 06/11/23 0930 18     Temp 06/11/23 0929 98.4 F (36.9 C)     Temp Source 06/11/23 0929 Oral     SpO2 06/11/23 0930 100 %     Weight 06/11/23 0929 94 lb (42.6 kg)     Height 06/11/23 0929 5\' 3"  (1.6 m)     Head Circumference --      Peak Flow --      Pain Score 06/11/23 0929 8     Pain Loc --      Pain Education --      Exclude from Growth Chart --     Most recent vital signs: Vitals:   06/11/23 0929 06/11/23 0930  BP:  (!) 129/114  Pulse:  98  Resp:  18  Temp: 98.4 F (36.9 C)   SpO2:  100%    Physical Exam Vitals and nursing note reviewed.  Constitutional:      General: Awake and alert. No acute distress.    Appearance: Normal appearance. The patient is normal weight.  HENT:     Head: Normocephalic and atraumatic.     Mouth: Mucous membranes are moist.  Eyes:     General: PERRL. Normal EOMs        Right eye: No discharge.        Left eye: No discharge.      Conjunctiva/sclera: Conjunctivae normal.  Cardiovascular:     Rate and Rhythm: Normal rate and regular rhythm.     Pulses: Normal pulses.  Pulmonary:     Effort: Pulmonary effort is normal. No respiratory distress.     Breath sounds: Normal breath sounds.  Abdominal:     Abdomen is soft. There is no abdominal tenderness. No rebound or guarding. No distention. Rectal exam with large stool ball in the rectal vault Musculoskeletal:        General: No swelling. Normal range of motion.     Cervical back: Normal range of motion and neck supple.  Skin:    General: Skin is warm and dry.     Capillary Refill: Capillary refill takes less than 2 seconds.     Findings: No rash.  Neurological:     Mental Status: The patient is awake and alert.  ED Results / Procedures / Treatments   Labs (all labs ordered are listed, but only abnormal results are displayed) Labs Reviewed - No data to display   EKG     RADIOLOGY     PROCEDURES:  Critical Care performed:   Fecal disimpaction  Date/Time: 06/11/2023 2:27 PM  Performed by: Jackelyn Hoehn, PA-C Authorized by: Jackelyn Hoehn, PA-C  Consent: Verbal consent obtained. Risks and benefits: risks, benefits and alternatives were discussed Consent given by: patient Patient understanding: patient states understanding of the procedure being performed Patient consent: the patient's understanding of the procedure matches consent given Procedure consent: procedure consent matches procedure scheduled Relevant documents: relevant documents present and verified Test results: test results available and properly labeled Site marked: the operative site was marked Required items: required blood products, implants, devices, and special equipment available Patient identity confirmed: verbally with patient Time out: Immediately prior to procedure a "time out" was called to verify the correct patient, procedure, equipment, support staff and  site/side marked as required. Preparation: Patient was prepped and draped in the usual sterile fashion. Local anesthesia used: no  Anesthesia: Local anesthesia used: no  Sedation: Patient sedated: no  Patient tolerance: patient tolerated the procedure well with no immediate complications Comments: Large brown stool ball in rectal vault      MEDICATIONS ORDERED IN ED: Medications  sodium phosphate (FLEET) 7-19 GM/118ML enema 1 enema (1 enema Rectal Given 06/11/23 1014)     IMPRESSION / MDM / ASSESSMENT AND PLAN / ED COURSE  I reviewed the triage vital signs and the nursing notes.   Differential diagnosis includes, but is not limited to, impaction, constipation, obstruction.  Patient is awake and alert, hemodynamically stable and afebrile.  She is rocking in the stretcher which, per chart review, it is normal for her due to her anxiety.  Patient endorses feeling very anxious about her current situation right now.  Patient has no abdominal pain or distention.  She has no nausea or vomiting.  I do not suspect bowel obstruction at this time.  She does not want any workup or imaging, reports that she wants a disimpaction only.  Patient agreed to manual disimpaction.  Large brown stool ball was palpated in the rectal vault and removed with improvement of her symptoms.  There was a small amount that was at the tip of my finger that I was unable to reach, and she was subsequently given a Fleet enema with copious stool output.  Patient reports that she feels significantly improved.  She felt that there was still a small amount left, and asked me to check again.  On digital rectal exam, her rectal vault had been completely cleared.  Patient is reassured.  She asked for suppository to take at home in case this happens again, this was prescribed for her.  I also recommended a high-fiber diet.  She also feels that her anxiety is interfering with her work, and requested information for outpatient  psychiatric services.  She was given the information for [facility].  She denies SI, HI currently.  She has goal-directed linear speech, and is calm and cooperative, I do not feel that she needs any emergent psychiatric consult now.  We did discuss strict return precautions and the importance of close outpatient follow-up.  Patient understands and agrees with plan.  She was discharged in stable condition.   Patient's presentation is most consistent with acute complicated illness / injury requiring diagnostic workup.   Clinical Course as of 06/11/23  1430  Mon Jun 11, 2023  1147 Repeat DRE reveals absolutely no stool in the rectal vault [JP]    Clinical Course User Index [JP] , Herb Grays, PA-C     FINAL CLINICAL IMPRESSION(S) / ED DIAGNOSES   Final diagnoses:  Fecal impaction in rectum [facility])  Anxiety     Rx / DC Orders   ED Discharge Orders          Ordered    glycerin adult 2 g suppository  Daily PRN        06/11/23 1151             Note:  This document was prepared using Dragon voice recognition software and may include unintentional dictation errors.   Keturah Shavers 06/11/23 1430    Willy Eddy, MD 06/11/23 1438

## 2023-06-11 NOTE — ED Triage Notes (Signed)
Patient to ED via Pov for constipation. States she had a small BM this AM but it "feels stuck." Took laxatives yesterday. Pt noted to be rocking in triage chair.

## 2023-06-11 NOTE — ED Notes (Signed)
See triage note  Presents with constipation  States she was able to pass a small amt this am  But still feels like more

## 2023-06-11 NOTE — Discharge Instructions (Addendum)
Your stool was removed from your rectum. Please remember to have a high fiber diet. It was a pleasure caring for you.

## 2023-06-22 ENCOUNTER — Other Ambulatory Visit: Payer: Self-pay

## 2023-06-22 ENCOUNTER — Emergency Department
Admission: EM | Admit: 2023-06-22 | Discharge: 2023-06-22 | Disposition: A | Discharge status: Home / Self Care | Payer: No Typology Code available for payment source | Attending: Emergency Medicine | Admitting: Emergency Medicine

## 2023-06-22 DIAGNOSIS — R451 Restlessness and agitation: Secondary | ICD-10-CM | POA: Diagnosis present

## 2023-06-22 DIAGNOSIS — Z79899 Other long term (current) drug therapy: Secondary | ICD-10-CM | POA: Insufficient documentation

## 2023-06-22 DIAGNOSIS — Z638 Other specified problems related to primary support group: Secondary | ICD-10-CM | POA: Insufficient documentation

## 2023-06-22 DIAGNOSIS — F419 Anxiety disorder, unspecified: Secondary | ICD-10-CM | POA: Diagnosis not present

## 2023-06-22 LAB — URINE DRUG SCREEN, QUALITATIVE (ARMC ONLY)
Amphetamines, Ur Screen: NOT DETECTED
Barbiturates, Ur Screen: NOT DETECTED
Benzodiazepine, Ur Scrn: POSITIVE — AB
Cannabinoid 50 Ng, Ur ~~LOC~~: NOT DETECTED
Cocaine Metabolite,Ur ~~LOC~~: NOT DETECTED
MDMA (Ecstasy)Ur Screen: NOT DETECTED
Methadone Scn, Ur: NOT DETECTED
Opiate, Ur Screen: NOT DETECTED
Phencyclidine (PCP) Ur S: NOT DETECTED
Tricyclic, Ur Screen: POSITIVE — AB

## 2023-06-22 LAB — CBC
HCT: 39.4 % (ref 36.0–46.0)
Hemoglobin: 12.8 g/dL (ref 12.0–15.0)
MCH: 30.5 pg (ref 26.0–34.0)
MCHC: 32.5 g/dL (ref 30.0–36.0)
MCV: 93.8 fL (ref 80.0–100.0)
Platelets: 161 10*3/uL (ref 150–400)
RBC: 4.2 MIL/uL (ref 3.87–5.11)
RDW: 13.3 % (ref 11.5–15.5)
WBC: 5.6 10*3/uL (ref 4.0–10.5)
nRBC: 0.4 % — ABNORMAL HIGH (ref 0.0–0.2)

## 2023-06-22 LAB — ACETAMINOPHEN LEVEL: Acetaminophen (Tylenol), Serum: <10 ug/mL — ABNORMAL LOW (ref 10–30)

## 2023-06-22 LAB — COMPREHENSIVE METABOLIC PANEL
ALT: 57 U/L — ABNORMAL HIGH (ref 0–44)
AST: 32 U/L (ref 15–41)
Albumin: 3.4 g/dL — ABNORMAL LOW (ref 3.5–5.0)
Alkaline Phosphatase: 150 U/L — ABNORMAL HIGH (ref 38–126)
Anion gap: 7 (ref 5–15)
BUN: 17 mg/dL (ref 8–23)
CO2: 25 mmol/L (ref 22–32)
Calcium: 8.7 mg/dL — ABNORMAL LOW (ref 8.9–10.3)
Chloride: 104 mmol/L (ref 98–111)
Creatinine, Ser: 0.97 mg/dL (ref 0.44–1.00)
GFR, Estimated: >60 mL/min (ref >60)
Glucose, Bld: 113 mg/dL — ABNORMAL HIGH (ref 70–99)
Potassium: 4.2 mmol/L (ref 3.5–5.1)
Sodium: 136 mmol/L (ref 135–145)
Total Bilirubin: 0.3 mg/dL (ref 0.3–1.2)
Total Protein: 7.2 g/dL (ref 6.5–8.1)

## 2023-06-22 LAB — SALICYLATE LEVEL: Salicylate Lvl: <7 mg/dL — ABNORMAL LOW (ref 7.0–30.0)

## 2023-06-22 LAB — ETHANOL: Alcohol, Ethyl (B): <10 mg/dL (ref <10)

## 2023-06-22 MED ORDER — LORAZEPAM 1 MG PO TABS: 1.0000 mg | ORAL_TABLET | Freq: Once | ORAL | Status: DC

## 2023-06-22 NOTE — ED Notes (Signed)
Pt reports no way of transportation and plans to drive herself home. This RN advised pt if she plans to drive then this RN cannot safely administer ativan due to safety concerns with driving. Pt verbalized understanding and declined ativan.

## 2023-06-22 NOTE — ED Notes (Signed)
Pt brought back to hallway 24. RN at bedside to introduce self. Pt anxious and rocking back and forth. Pt provided blanket.
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
## 2023-06-22 NOTE — BH Assessment (Signed)
Comprehensive Clinical Assessment (CCA) Screening, Triage and Referral Note  06/22/2023 [name] [id number]  Chief Complaint:  Chief Complaint  Patient presents with   Psychiatric Evaluation   Visit Diagnosis: Anxiety Disorder  [name] A. Dobberstein is a 67 year old female who presents to the ER due to having an increase of panic attacks, anxiety and muscle jerking. She states it has gone on for serval months and it is increasing. She shared, her primary doctor prescribed medications but it wasn't helping. Thus, she was instructed to come to the ER for him. However, when she was seen by TTS, patient stated she was her all day and wanted to go home. She denies SI/HI and AV/H. TTS encourage her to see one of the psychiatric providers but she said she didn't want to stay any longer but wanted to go home.  TTS informed ER MD (Dr. Rosalia Hammers).  Patient Reported Information How did you hear about [place]? Self  What Is the Reason for Your Visit/Call Today? Patient having increase panic attacks and jerks  How Long Has This Been Causing You Problems? 1 wk - 1 month  What Do You Feel Would Help You the Most Today? Treatment for Depression or other mood problem   Have You Recently Had Any Thoughts About Hurting Yourself? No  Are You Planning to Commit Suicide/Harm Yourself At This time? No   Have you Recently Had Thoughts About Hurting Someone [name]? No  Are You Planning to Harm Someone at This Time? No  Explanation: No data recorded  Have You Used Any Alcohol or Drugs in the Past 24 Hours? No  How Long Ago Did You Use Drugs or Alcohol? No data recorded What Did You Use and How Much? No data recorded  Do You Currently Have a Therapist/Psychiatrist? No  Name of Therapist/Psychiatrist: No data recorded  Have You Been Recently Discharged From Any Office Practice or Programs? No  Explanation of Discharge From Practice/Program: No data recorded   CCA Screening Triage Referral Assessment Type of  Contact: Face-to-Face  Telemedicine Service Delivery:   Is this Initial or Reassessment?   Date Telepsych consult ordered in [facility]:    Time Telepsych consult ordered in [facility]:    Location of Assessment: [facility] ED  Provider Location: [facility]    Collateral Involvement: No data recorded  Does Patient Have a Court Appointed Legal Guardian? No data recorded Name and Contact of Legal Guardian: No data recorded If Minor and Not Living with Parent(s), Who has Custody? No data recorded Is CPS involved or ever been involved? Never  Is APS involved or ever been involved? Never   Patient Determined To Be At Risk for Harm To Self or Others Based on Review of Patient Reported Information or Presenting Complaint? No data recorded Method: No data recorded Availability of Means: No data recorded Intent: No data recorded Notification Required: No data recorded Additional Information for Danger to Others Potential: No data recorded Additional Comments for Danger to Others Potential: No data recorded Are There Guns or Other Weapons in Your Home? No data recorded Types of Guns/Weapons: No data recorded Are These Weapons Safely Secured?                            No data recorded Who Could Verify You Are Able To Have These Secured: No data recorded Do You Have any Outstanding Charges, Pending Court Dates, Parole/Probation? No data recorded Contacted To Inform of Risk of Harm  To Self or Others: No data recorded  Does Patient Present under Involuntary Commitment? No   County of Residence: [place]   Patient Currently Receiving the Following Services: Not Receiving Services   Determination of Need: Emergent (2 hours)   Options For Referral: ED Visit   Discharge Disposition:    [name] MS, LCAS, [facility], [facility] Therapeutic Triage Specialist 06/22/2023 5:59 PM
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
## 2023-06-22 NOTE — Discharge Instructions (Signed)
Please follow with your primary care doctor for further evaluation.  Return to the ER for new or worsening symptoms.

## 2023-06-22 NOTE — ED Notes (Signed)
EDP at bedside  

## 2023-06-24 ENCOUNTER — Encounter (HOSPITAL_COMMUNITY): Payer: Self-pay

## 2023-06-24 ENCOUNTER — Other Ambulatory Visit: Payer: Self-pay

## 2023-06-24 ENCOUNTER — Emergency Department (HOSPITAL_COMMUNITY)
Admission: EM | Admit: 2023-06-24 | Discharge: 2023-06-24 | Disposition: A | Discharge status: Home / Self Care | Payer: No Typology Code available for payment source | Attending: Emergency Medicine | Admitting: Emergency Medicine

## 2023-06-24 DIAGNOSIS — F419 Anxiety disorder, unspecified: Secondary | ICD-10-CM | POA: Diagnosis present

## 2023-06-24 MED ORDER — LORAZEPAM 2 MG/ML IJ SOLN
1.0000 mg | Freq: Once | INTRAMUSCULAR | Status: AC
  Administered 2023-06-24: 1 mg via INTRAMUSCULAR
  Filled 2023-06-24: qty 1

## 2023-06-24 NOTE — ED Notes (Signed)
Spoke with PT and she agreed to speak to the PA after she received her ativan

## 2023-06-24 NOTE — ED Triage Notes (Signed)
Pt came in via POV d/t having anxiety & a panic attack that has been happening daily for the last month where she is rocking in her seat, unable to be still. A/Ox4, denies pain, just endorses anxiousness. Has seen her PCP & has been prescribed many meds & not has helped (per pt.

## 2023-06-24 NOTE — ED Provider Notes (Signed)
[place] EMERGENCY DEPARTMENT AT [facility] Provider Note   CSN: [id number] Arrival date & time: 06/24/23  1454     History  Chief Complaint  Patient presents with   Anxiety    [name] is a 67 y.o. female with past medical history GERD, IBS, anxiety who presents to the ED complaining of uncontrolled anxiety and panic attacks.  States that she has been on multiple medications prescribed by her PCP but they are not working.  Unsure what medications.  Per chart review, it appears that patient has taken sertraline, clonazepam, lorazepam, Seroquel, and hydroxyzine before but it is unclear which of these medication she is taking at this time.  She denies suicidal ideation.  Does state that she is feeling depressed due to her poorly controlled anxiety.  Unable to obtain further history at this time secondary to patient's significant anxiety.      Home Medications Prior to Admission medications   Medication Sig Start Date End Date Taking? Authorizing Provider  acetaminophen (TYLENOL) 500 MG tablet Take 1,000 mg by mouth 3 (three) times daily as needed. For pain    [provider]  amitriptyline (ELAVIL) 150 MG tablet Take 150 mg by mouth Nightly. 12/12/18   [provider]  citalopram (CELEXA) 40 MG tablet Take 40 mg by mouth daily.      [provider]  clobetasol ointment (TEMOVATE) 0.05 % Apply 1 Application topically 3 (three) times a week. Use small amount on vulva 3 times a week at night 05/29/22   Carver Fila, MD  clonazePAM (KLONOPIN) 0.5 MG tablet Take 0.5 mg by mouth 2 (two) times daily.     [provider]  escitalopram (LEXAPRO) 20 MG tablet Take 30 mg by mouth daily. 06/11/17   [provider]  glycerin adult 2 g suppository Place 1 suppository rectally daily as needed for constipation. 06/11/23   Poggi, Herb Grays, PA-C  hydrOXYzine (ATARAX) 10 MG tablet Take 1 tablet (10 mg total) by mouth 3 (three) times daily as  needed for anxiety. 11/06/22   Corena Herter, MD  linaclotide (LINZESS) 290 MCG CAPS capsule Take 1 capsule by mouth daily. 02/28/21   [provider]  montelukast (SINGULAIR) 10 MG tablet Take 10 mg by mouth Nightly. 06/11/17   [provider]  pantoprazole (PROTONIX) 40 MG tablet Take 40 mg by mouth 2 (two) times daily. 06/11/17   [provider]  QUEtiapine Fumarate (SEROQUEL XR) 150 MG 24 hr tablet Take by mouth. 02/15/21   [provider]  sertraline (ZOLOFT) 100 MG tablet Take 1 tablet by mouth daily. 02/15/21   [provider]  simvastatin (ZOCOR) 20 MG tablet Take 20 mg by mouth at bedtime.      [provider]  tiZANidine (ZANAFLEX) 2 MG tablet Take 2 mg by mouth every 6 (six) hours as needed. 09/09/21   [provider]  traMADol (ULTRAM) 50 MG tablet Take 1 tablet (50 mg total) by mouth every 6 (six) hours as needed. 11/15/21   Fisher, Roselyn Bering, PA-C      Allergies    Penicillins, Reglan [metoclopramide], Sulfonamide derivatives, and Meperidine hcl    Review of Systems   Review of Systems  Unable to perform ROS: Psychiatric disorder    Physical Exam Updated Vital Signs BP 127/61 (BP Location: Right Arm)   Pulse 83   Temp 98 F (36.7 C) (Oral)   Resp 16   Ht 5\' 3"  (1.6 m)  Wt 43.1 kg   SpO2 96%   BMI 16.83 kg/m  Physical Exam Vitals and nursing note reviewed.  Constitutional:      General: She is not in acute distress.    Appearance: Normal appearance. She is not ill-appearing or toxic-appearing.  HENT:     Head: Normocephalic and atraumatic.     Mouth/Throat:     Mouth: Mucous membranes are moist.  Eyes:     Extraocular Movements: Extraocular movements intact.     Conjunctiva/sclera: Conjunctivae normal.     Pupils: Pupils are equal, round, and reactive to light.  Neck:     Comments: No meningismus Cardiovascular:     Rate and Rhythm: Normal rate and regular rhythm.     Heart sounds: No murmur  heard. Pulmonary:     Effort: Pulmonary effort is normal.     Breath sounds: Normal breath sounds.  Abdominal:     General: Abdomen is flat.     Palpations: Abdomen is soft.     Tenderness: There is no abdominal tenderness.  Musculoskeletal:        General: Normal range of motion.     Cervical back: Normal range of motion and neck supple.  Skin:    General: Skin is warm and dry.     Capillary Refill: Capillary refill takes less than 2 seconds.  Neurological:     General: No focal deficit present.     Mental Status: She is alert.  Psychiatric:        Mood and Affect: Mood is anxious. Affect is tearful.        Speech: Speech is delayed.        Behavior: Behavior is agitated.        Thought Content: Thought content does not include suicidal ideation. Thought content does not include suicidal plan.     Comments: Very anxious, rocking back and forth, intermittently noncommunicative     ED Results / Procedures / Treatments   Labs (all labs ordered are listed, but only abnormal results are displayed) Labs Reviewed - No data to display  EKG None  Radiology No results found.  Procedures Procedures    Medications Ordered in ED Medications  LORazepam (ATIVAN) injection 1 mg (1 mg Intramuscular Given 06/24/23 1805)    ED Course/ Medical Decision Making/ A&P                                 Medical Decision Making Risk Prescription drug management.   Medical Decision Making:   [name] is a 67 y.o. female who presented to the ED today with anxiety detailed above.    Patient's presentation is complicated by their history of failed multiple medication trials, advanced age.  Complete initial physical exam performed, notably the patient was in no acute distress, nontoxic-appearing.  No meningismus.  She was very anxious but denied suicidal ideation or plan.  Did not appear to be responding to internal stimuli.    Reviewed and confirmed nursing documentation for past  medical history, family history, social history.    Initial Assessment:   With the patient's presentation, differential diagnosis includes but is not limited to decompensated mood or anxiety disorder, thyroid dysfunction, electrolyte disturbance, infectious process, acute psychosis. This is most consistent with an acute complicated illness  Initial Plan:  Objective evaluation as below reviewed   Final Assessment and Plan:   67 year old female presents to the ED complaining  of poorly managed anxiety.  On initial assessment, patient is very anxious, rocking back and forth, and is difficult to obtain history secondary to her anxiety.  She did denies suicidal or homicidal ideation.  Did not appear to be responding to internal stimuli.  Nontoxic-appearing.  Denies any medical complaints.  Following dose of IM Ativan, able to obtain full history from patient.  She states that she is having a lot of financial and personal stressors. States that her primary has trialed multiple medications to manage her anxiety and depression but none of them have worked.  On PDMP review, patient has had a large number of benzodiazepines filled within the last 2 to 3 weeks.  States that she is currently taking her home medications that was unsure which ones.  States that she has had difficulty getting into psychiatry due to her current insurance plan.  Patient does reiterate multiple times that p.o. benzodiazepines do not work for her.  Did administer 1 dose given lack of history and why patient is presenting to the ED on initial assessment.  I do have some concern that patient is malingering with her specific medication request.  As she has recently been prescribed multiple psychiatric medications, will not prescribe any additional for home.  Encourage close outpatient follow-up with her primary care.  Provided information for [facility] behavioral health and encourage patient to go there for any acutely worsening symptoms.   Also provided resource guide with additional counseling and psychiatric medication management specialist.  Patient expressed understanding and agreement with plan.  Anxiety improved following Ativan.  No further complaints.  Vital signs stable.  Strict ED return precautions given, all questions answered, and stable for discharge.   Clinical Impression:  1. Anxiety      Discharge           Final Clinical Impression(s) / ED Diagnoses Final diagnoses:  Anxiety    Rx / DC Orders ED Discharge Orders     None         Tonette Lederer, PA-C 06/24/23 1834    Glyn Ade, MD 06/25/23 1457

## 2023-06-24 NOTE — ED Notes (Signed)
Called for triage, no answer

## 2023-06-24 NOTE — Discharge Instructions (Signed)
Thank you for letting us take care of you today.  We gave you an injection to help with your anxiety. Do not drive after receiving this medication.   Call your PCP tomorrow to discuss follow up for medications adjustments for better anxiety and depression control and referral to a psychiatrist. I also provided information for the local behavioral health clinic that is open 24/7 to discuss mental health concerns and other mental health professionals that help with counseling and medication management. You will likely need both therapy and medication to get your anxiety under better control.  If you develop thoughts or plans of harming yourself or others or other new, concerning symptoms, return to nearest ED for re-evaluation.

## 2023-06-24 NOTE — ED Notes (Signed)
PT discharged and left with her snack bag that she requested.

## 2023-07-19 ENCOUNTER — Ambulatory Visit: Payer: No Typology Code available for payment source | Admitting: Dermatology

## 2023-07-27 ENCOUNTER — Encounter (INDEPENDENT_AMBULATORY_CARE_PROVIDER_SITE_OTHER): Payer: No Typology Code available for payment source | Admitting: Ophthalmology

## 2023-07-28 ENCOUNTER — Emergency Department: Payer: No Typology Code available for payment source

## 2023-07-28 ENCOUNTER — Other Ambulatory Visit: Payer: Self-pay

## 2023-07-28 ENCOUNTER — Emergency Department
Admission: EM | Admit: 2023-07-28 | Discharge: 2023-07-28 | Disposition: A | Discharge status: Home / Self Care | Payer: No Typology Code available for payment source | Attending: Emergency Medicine | Admitting: Emergency Medicine

## 2023-07-28 DIAGNOSIS — R630 Anorexia: Secondary | ICD-10-CM | POA: Diagnosis not present

## 2023-07-28 DIAGNOSIS — R0602 Shortness of breath: Secondary | ICD-10-CM | POA: Insufficient documentation

## 2023-07-28 DIAGNOSIS — R059 Cough, unspecified: Secondary | ICD-10-CM | POA: Diagnosis not present

## 2023-07-28 DIAGNOSIS — R5383 Other fatigue: Secondary | ICD-10-CM | POA: Insufficient documentation

## 2023-07-28 DIAGNOSIS — R531 Weakness: Secondary | ICD-10-CM | POA: Insufficient documentation

## 2023-07-28 DIAGNOSIS — R112 Nausea with vomiting, unspecified: Secondary | ICD-10-CM

## 2023-07-28 DIAGNOSIS — Z1152 Encounter for screening for COVID-19: Secondary | ICD-10-CM | POA: Insufficient documentation

## 2023-07-28 DIAGNOSIS — B349 Viral infection, unspecified: Secondary | ICD-10-CM | POA: Diagnosis not present

## 2023-07-28 LAB — CBC
HCT: 40.6 % (ref 36.0–46.0)
Hemoglobin: 13.3 g/dL (ref 12.0–15.0)
MCH: 30.6 pg (ref 26.0–34.0)
MCHC: 32.8 g/dL (ref 30.0–36.0)
MCV: 93.3 fL (ref 80.0–100.0)
Platelets: 178 10*3/uL (ref 150–400)
RBC: 4.35 MIL/uL (ref 3.87–5.11)
RDW: 13.5 % (ref 11.5–15.5)
WBC: 7.7 10*3/uL (ref 4.0–10.5)
nRBC: 0 % (ref 0.0–0.2)

## 2023-07-28 LAB — BASIC METABOLIC PANEL
Anion gap: 12 (ref 5–15)
BUN: 18 mg/dL (ref 8–23)
CO2: 24 mmol/L (ref 22–32)
Calcium: 9.1 mg/dL (ref 8.9–10.3)
Chloride: 103 mmol/L (ref 98–111)
Creatinine, Ser: 0.91 mg/dL (ref 0.44–1.00)
GFR, Estimated: >60 mL/min (ref >60)
Glucose, Bld: 108 mg/dL — ABNORMAL HIGH (ref 70–99)
Potassium: 3.9 mmol/L (ref 3.5–5.1)
Sodium: 139 mmol/L (ref 135–145)

## 2023-07-28 LAB — URINALYSIS, ROUTINE W REFLEX MICROSCOPIC
Bacteria, UA: NONE SEEN
Bilirubin Urine: NEGATIVE
Glucose, UA: NEGATIVE mg/dL
Ketones, ur: 20 mg/dL — AB
Leukocytes,Ua: NEGATIVE
Nitrite: NEGATIVE
Protein, ur: 100 mg/dL — AB
Specific Gravity, Urine: 1.021 (ref 1.005–1.030)
pH: 6 (ref 5.0–8.0)

## 2023-07-28 LAB — RESP PANEL BY RT-PCR (RSV, FLU A&B, COVID)  RVPGX2
Influenza A by PCR: NEGATIVE
Influenza B by PCR: NEGATIVE
Resp Syncytial Virus by PCR: NEGATIVE
SARS Coronavirus 2 by RT PCR: NEGATIVE

## 2023-07-28 MED ORDER — ONDANSETRON 4 MG PO TBDP
4.0000 mg | ORAL_TABLET | Freq: Three times a day (TID) | ORAL | 0 refills | Status: DC | PRN
  Filled 2023-07-28: qty 12, 4d supply, fill #0

## 2023-07-28 MED ORDER — ALBUTEROL SULFATE (2.5 MG/3ML) 0.083% IN NEBU
2.5000 mg | INHALATION_SOLUTION | Freq: Once | RESPIRATORY_TRACT | Status: AC
  Administered 2023-07-28: 2.5 mg via RESPIRATORY_TRACT
  Filled 2023-07-28: qty 3

## 2023-07-28 MED ORDER — ALBUTEROL SULFATE HFA 108 (90 BASE) MCG/ACT IN AERS: 2.0000 | INHALATION_SPRAY | RESPIRATORY_TRACT | 0 refills | Status: DC | PRN

## 2023-07-28 MED ORDER — ALBUTEROL SULFATE HFA 108 (90 BASE) MCG/ACT IN AERS
2.0000 | INHALATION_SPRAY | RESPIRATORY_TRACT | 0 refills | Status: DC | PRN
  Filled 2023-07-28: qty 13.4, fill #0

## 2023-07-28 MED ORDER — ONDANSETRON HCL 4 MG/2ML IJ SOLN
4.0000 mg | Freq: Once | INTRAMUSCULAR | Status: AC
  Administered 2023-07-28: 4 mg via INTRAVENOUS
  Filled 2023-07-28: qty 2

## 2023-07-28 MED ORDER — SODIUM CHLORIDE 0.9 % IV SOLN
12.5000 mg | Freq: Four times a day (QID) | INTRAVENOUS | Status: DC | PRN
  Administered 2023-07-28: 12.5 mg via INTRAVENOUS
  Filled 2023-07-28: qty 12.5

## 2023-07-28 MED ORDER — ONDANSETRON 4 MG PO TBDP: 4.0000 mg | ORAL_TABLET | Freq: Three times a day (TID) | ORAL | 0 refills | Status: DC | PRN

## 2023-07-28 MED ORDER — PREDNISONE 20 MG PO TABS: 40.0000 mg | ORAL_TABLET | Freq: Every day | ORAL | 0 refills | Status: AC

## 2023-07-28 MED ORDER — PREDNISONE 20 MG PO TABS
40.0000 mg | ORAL_TABLET | Freq: Every day | ORAL | 0 refills | Status: DC
  Filled 2023-07-28: qty 10, 5d supply, fill #0

## 2023-07-28 MED ORDER — SODIUM CHLORIDE 0.9 % IV BOLUS
1000.0000 mL | Freq: Once | INTRAVENOUS | Status: AC
  Administered 2023-07-28: 1000 mL via INTRAVENOUS

## 2023-07-28 NOTE — ED Provider Notes (Signed)
[facility] Provider Note    Event Date/Time   First MD Initiated Contact with Patient 07/28/23 1804     (approximate)   History   Emesis and Shortness of Breath   HPI  [name] is a 67 y.o. female with a history of GERD, IBS, and anxiety who presents with shortness of breath over the last 3 days, gradual onset, persistent course, associated with nonproductive cough, generalized weakness and fatigue, and decreased appetite.  The patient states that she has had nausea and several episodes of vomiting.  She denies diarrhea.  She has no abdominal pain.  She denies any specific sick contacts.   I reviewed the past medical records.  The patient's most recent ED visit was on 8/18 for anxiety.  Her other most recent outpatient encounter was with urgent care at [facility] on 5/7 for acute sinusitis.   Physical Exam   Triage Vital Signs: ED Triage Vitals  Encounter Vitals Group     BP 07/28/23 1734 110/72     Systolic BP Percentile --      Diastolic BP Percentile --      Pulse Rate 07/28/23 1734 77     Resp 07/28/23 1734 20     Temp 07/28/23 1734 98 F (36.7 C)     Temp Source 07/28/23 1734 Oral     SpO2 07/28/23 1734 95 %     Weight --      Height --      Head Circumference --      Peak Flow --      Pain Score 07/28/23 1650 8     Pain Loc --      Pain Education --      Exclude from Growth Chart --     Most recent vital signs: Vitals:   07/28/23 2100 07/28/23 2136  BP: (!) 114/54   Pulse: 78   Resp: 14   Temp:  98 F (36.7 C)  SpO2: 96%      General: Alert, weak appearing but in no distress.  CV:  Good peripheral perfusion.  Resp:  Normal effort.  Lungs CTAB. Abd:  Soft and nontender.  No distention.  Other:  Dry mucous membranes.  No jaundice or scleral icterus.   ED Results / Procedures / Treatments   Labs (all labs ordered are listed, but only abnormal results are displayed) Labs Reviewed  BASIC METABOLIC PANEL - Abnormal; Notable  for the following components:      Result Value   Glucose, Bld 108 (*)    All other components within normal limits  URINALYSIS, ROUTINE W REFLEX MICROSCOPIC - Abnormal; Notable for the following components:   Color, Urine YELLOW (*)    APPearance HAZY (*)    Hgb urine dipstick SMALL (*)    Ketones, ur 20 (*)    Protein, ur 100 (*)    All other components within normal limits  RESP PANEL BY RT-PCR (RSV, FLU A&B, COVID)  RVPGX2  CBC  CBG MONITORING, ED     EKG  ED ECG REPORT I, Dionne Bucy, the attending physician, personally viewed and interpreted this ECG.  Date: 07/28/2023 EKG Time: 1656 Rate: 77 Rhythm: normal sinus rhythm QRS Axis: normal Intervals: normal ST/T Wave abnormalities: normal Narrative Interpretation: LVH with no evidence of acute ischemia    RADIOLOGY  Chest x-ray: I independently viewed and interpreted the images; there is no focal consolidation or edema  PROCEDURES:  Critical Care performed: No  Procedures   MEDICATIONS ORDERED IN ED: Medications  promethazine (PHENERGAN) 12.5 mg in sodium chloride 0.9 % 50 mL IVPB (12.5 mg Intravenous New Bag/Given 07/28/23 2214)  sodium chloride 0.9 % bolus 1,000 mL (1,000 mLs Intravenous Bolus from Bag 07/28/23 1925)  ondansetron (ZOFRAN) injection 4 mg (4 mg Intravenous Given 07/28/23 1926)  albuterol (PROVENTIL) (2.5 MG/3ML) 0.083% nebulizer solution 2.5 mg (2.5 mg Nebulization Given 07/28/23 1926)  albuterol (PROVENTIL) (2.5 MG/3ML) 0.083% nebulizer solution 2.5 mg (2.5 mg Nebulization Given 07/28/23 1926)     IMPRESSION / MDM / ASSESSMENT AND PLAN / ED COURSE  I reviewed the triage vital signs and the nursing notes.  67 year old female with PMH as noted above presents with shortness of breath over the last several days associated with nausea and vomiting, generalized weakness and fatigue.  On exam her vital signs are normal.  Lungs are clear to auscultation.  She does not demonstrate any increased  work of breathing or respiratory distress.  Differential diagnosis includes, but is not limited to, COVID-19, other viral syndrome, pneumonia, viral gastroenteritis, dehydration, electrolyte abnormality.  There is no clinical evidence for cardiac etiology.  We will obtain labs, respiratory panel, chest x-ray, give fluids, bronchodilators, antiemetic, and reassess.  Patient's presentation is most consistent with acute complicated illness / injury requiring diagnostic workup.  The patient is on the cardiac monitor to evaluate for evidence of arrhythmia and/or significant heart rate changes.  ----------------------------------------- 11:06 PM on 07/28/2023 -----------------------------------------  Chest x-ray shows no acute findings.  Respiratory panel is negative.  Urinalysis shows some ketones and protein but no bacteria or evidence of UTI.  CBC shows no leukocytosis or anemia.  BMP is normal.  The patient had good relief of her shortness of breath with the albuterol but was still somewhat nauseous.  I gave a dose of Phenergan and fluids and she is feeling significantly better.  She feels comfortable going home.  Overall presentation is most consistent with viral syndrome.  I have prescribed albuterol and prednisone for the shortness of breath and Zofran as needed for nausea.  I counseled the patient on the results of the workup and the plan of care.  I gave strict return precautions and she expresses understanding.   FINAL CLINICAL IMPRESSION(S) / ED DIAGNOSES   Final diagnoses:  Viral syndrome  Nausea and vomiting, unspecified vomiting type     Rx / DC Orders   ED Discharge Orders          Ordered    albuterol (VENTOLIN HFA) 108 (90 Base) MCG/ACT inhaler  Every 4 hours PRN,   Status:  Discontinued        07/28/23 2305    ondansetron (ZOFRAN-ODT) 4 MG disintegrating tablet  Every 8 hours PRN,   Status:  Discontinued        07/28/23 2305    predniSONE (DELTASONE) 20 MG tablet  Daily  with breakfast,   Status:  Discontinued        07/28/23 2305    predniSONE (DELTASONE) 20 MG tablet  Daily with breakfast        07/28/23 2306    ondansetron (ZOFRAN-ODT) 4 MG disintegrating tablet  Every 8 hours PRN        07/28/23 2306    albuterol (VENTOLIN HFA) 108 (90 Base) MCG/ACT inhaler  Every 4 hours PRN        07/28/23 2306             Note:  This document was prepared using  [employer]agon [employer] and may include unintentional dictation errors.    Dionne Bucy, MD 07/28/23 [phone]

## 2023-07-28 NOTE — Discharge Instructions (Addendum)
Take the prednisone and use the albuterol inhaler over the next 4 to 5 days which will help with your breathing.  You may use the Zofran as needed for nausea.  Return to the ER for new, worsening, or persistent severe nausea or vomiting, shortness of breath, high fever, weakness, or any other new or worsening symptoms that concern you.

## 2023-07-28 NOTE — ED Triage Notes (Signed)
Pt to ED via [facility] from home. Pt called out for SOB. Upon EMS arrival pt stated "I feel bad". Pt reports N/V and inability to eat x3 days.   EMS VS: 98.4 118/68 99% RA (2L [place] for comfort)

## 2023-07-30 ENCOUNTER — Other Ambulatory Visit (HOSPITAL_BASED_OUTPATIENT_CLINIC_OR_DEPARTMENT_OTHER): Payer: Self-pay

## 2023-08-18 ENCOUNTER — Emergency Department
Admission: EM | Admit: 2023-08-18 | Discharge: 2023-08-18 | Disposition: A | Discharge status: Home / Self Care | Payer: No Typology Code available for payment source | Attending: Emergency Medicine | Admitting: Emergency Medicine

## 2023-08-18 ENCOUNTER — Other Ambulatory Visit: Payer: Self-pay

## 2023-08-18 ENCOUNTER — Emergency Department: Payer: No Typology Code available for payment source

## 2023-08-18 DIAGNOSIS — R1010 Upper abdominal pain, unspecified: Secondary | ICD-10-CM | POA: Diagnosis present

## 2023-08-18 DIAGNOSIS — K5732 Diverticulitis of large intestine without perforation or abscess without bleeding: Secondary | ICD-10-CM | POA: Diagnosis not present

## 2023-08-18 LAB — URINALYSIS, ROUTINE W REFLEX MICROSCOPIC
Bilirubin Urine: NEGATIVE
Glucose, UA: NEGATIVE mg/dL
Ketones, ur: NEGATIVE mg/dL
Leukocytes,Ua: NEGATIVE
Nitrite: NEGATIVE
Protein, ur: NEGATIVE mg/dL
Specific Gravity, Urine: 1.015 (ref 1.005–1.030)
pH: 5 (ref 5.0–8.0)

## 2023-08-18 LAB — COMPREHENSIVE METABOLIC PANEL
ALT: 22 U/L (ref 0–44)
AST: 29 U/L (ref 15–41)
Albumin: 3.4 g/dL — ABNORMAL LOW (ref 3.5–5.0)
Alkaline Phosphatase: 99 U/L (ref 38–126)
Anion gap: 4 — ABNORMAL LOW (ref 5–15)
BUN: 17 mg/dL (ref 8–23)
CO2: 24 mmol/L (ref 22–32)
Calcium: 8.5 mg/dL — ABNORMAL LOW (ref 8.9–10.3)
Chloride: 107 mmol/L (ref 98–111)
Creatinine, Ser: 0.68 mg/dL (ref 0.44–1.00)
GFR, Estimated: >60 mL/min (ref >60)
Glucose, Bld: 106 mg/dL — ABNORMAL HIGH (ref 70–99)
Potassium: 4.1 mmol/L (ref 3.5–5.1)
Sodium: 135 mmol/L (ref 135–145)
Total Bilirubin: 0.5 mg/dL (ref 0.3–1.2)
Total Protein: 7.5 g/dL (ref 6.5–8.1)

## 2023-08-18 LAB — CBC
HCT: 37.1 % (ref 36.0–46.0)
Hemoglobin: 12 g/dL (ref 12.0–15.0)
MCH: 30.8 pg (ref 26.0–34.0)
MCHC: 32.3 g/dL (ref 30.0–36.0)
MCV: 95.1 fL (ref 80.0–100.0)
Platelets: 200 10*3/uL (ref 150–400)
RBC: 3.9 MIL/uL (ref 3.87–5.11)
RDW: 13.9 % (ref 11.5–15.5)
WBC: 7.7 10*3/uL (ref 4.0–10.5)
nRBC: 0 % (ref 0.0–0.2)

## 2023-08-18 LAB — LIPASE, BLOOD: Lipase: 33 U/L (ref 11–51)

## 2023-08-18 MED ORDER — CIPROFLOXACIN HCL 500 MG PO TABS
500.0000 mg | ORAL_TABLET | Freq: Once | ORAL | Status: AC
  Administered 2023-08-18: 500 mg via ORAL
  Filled 2023-08-18: qty 1

## 2023-08-18 MED ORDER — SODIUM CHLORIDE 0.9 % IV BOLUS
1000.0000 mL | Freq: Once | INTRAVENOUS | Status: AC
  Administered 2023-08-18: 1000 mL via INTRAVENOUS

## 2023-08-18 MED ORDER — CIPROFLOXACIN HCL 500 MG PO TABS: 500.0000 mg | ORAL_TABLET | Freq: Two times a day (BID) | ORAL | 0 refills | Status: DC

## 2023-08-18 MED ORDER — ONDANSETRON 4 MG PO TBDP: 4.0000 mg | ORAL_TABLET | Freq: Three times a day (TID) | ORAL | 0 refills | Status: DC | PRN

## 2023-08-18 MED ORDER — OXYCODONE-ACETAMINOPHEN 5-325 MG PO TABS
1.0000 | ORAL_TABLET | Freq: Four times a day (QID) | ORAL | 0 refills | Status: AC | PRN
Start: 2023-08-18 — End: 2023-08-21

## 2023-08-18 MED ORDER — KETOROLAC TROMETHAMINE 15 MG/ML IJ SOLN
15.0000 mg | Freq: Once | INTRAMUSCULAR | Status: AC
  Administered 2023-08-18: 15 mg via INTRAVENOUS
  Filled 2023-08-18: qty 1

## 2023-08-18 MED ORDER — PANTOPRAZOLE SODIUM 40 MG IV SOLR
40.0000 mg | Freq: Once | INTRAVENOUS | Status: AC
  Administered 2023-08-18: 40 mg via INTRAVENOUS
  Filled 2023-08-18: qty 10

## 2023-08-18 MED ORDER — METRONIDAZOLE 500 MG PO TABS
500.0000 mg | ORAL_TABLET | Freq: Once | ORAL | Status: AC
  Administered 2023-08-18: 500 mg via ORAL
  Filled 2023-08-18: qty 1

## 2023-08-18 MED ORDER — METRONIDAZOLE 500 MG PO TABS: 500.0000 mg | ORAL_TABLET | Freq: Three times a day (TID) | ORAL | 0 refills | Status: DC

## 2023-08-18 MED ORDER — ONDANSETRON HCL 4 MG/2ML IJ SOLN
4.0000 mg | Freq: Once | INTRAMUSCULAR | Status: AC
  Administered 2023-08-18: 4 mg via INTRAVENOUS
  Filled 2023-08-18: qty 2

## 2023-08-18 MED ORDER — MORPHINE SULFATE (PF) 4 MG/ML IV SOLN
4.0000 mg | Freq: Once | INTRAVENOUS | Status: AC
  Administered 2023-08-18: 4 mg via INTRAVENOUS
  Filled 2023-08-18: qty 1

## 2023-08-18 MED ORDER — IOHEXOL 300 MG/ML  SOLN
60.0000 mL | Freq: Once | INTRAMUSCULAR | Status: AC | PRN
  Administered 2023-08-18: 60 mL via INTRAVENOUS

## 2023-08-18 NOTE — ED Triage Notes (Signed)
N/V/D with a head ache for the last day.

## 2023-08-18 NOTE — ED Provider Notes (Signed)
[facility] Provider Note    Event Date/Time   First MD Initiated Contact with Patient 08/18/23 1232     (approximate)   History   Chief Complaint: Abdominal Pain   HPI  [name] is a 67 y.o. female with a history of GERD, IBS, who comes to the ED with nausea vomiting and upper abdominal pain for the last 2 days, loss of appetite, poor oral intake.  To me she denies diarrhea or constipation.  No dysuria.     Physical Exam   Triage Vital Signs: ED Triage Vitals [08/18/23 1125]  Encounter Vitals Group     BP 108/77     Systolic BP Percentile      Diastolic BP Percentile      Pulse Rate 76     Resp 17     Temp 97.8 F (36.6 C)     Temp Source Oral     SpO2 100 %     Weight 90 lb (40.8 kg)     Height 5\' 3"  (1.6 m)     Head Circumference      Peak Flow      Pain Score 8     Pain Loc      Pain Education      Exclude from Growth Chart     Most recent vital signs: Vitals:   08/18/23 1125 08/18/23 1544  BP: 108/77 110/78  Pulse: 76 77  Resp: 17 16  Temp: 97.8 F (36.6 C) 97.9 F (36.6 C)  SpO2: 100% 98%    General: Awake, no distress.  CV:  Good peripheral perfusion.  Regular rate rhythm Resp:  Normal effort.  Clear to auscultation bilaterally Abd:  No distention.  Soft with diffuse left-sided tenderness.  No peritonitis. Other:  Dry oral mucosa   ED Results / Procedures / Treatments   Labs (all labs ordered are listed, but only abnormal results are displayed) Labs Reviewed  COMPREHENSIVE METABOLIC PANEL - Abnormal; Notable for the following components:      Result Value   Glucose, Bld 106 (*)    Calcium 8.5 (*)    Albumin 3.4 (*)    Anion gap 4 (*)    All other components within normal limits  URINALYSIS, ROUTINE W REFLEX MICROSCOPIC - Abnormal; Notable for the following components:   Color, Urine YELLOW (*)    APPearance CLEAR (*)    Hgb urine dipstick MODERATE (*)    Bacteria, UA RARE (*)    All other components  within normal limits  LIPASE, BLOOD  CBC     EKG    RADIOLOGY CT interpreted by me, negative for bowel obstruction or perforation.  Radiology report reviewed   PROCEDURES:  Procedures   MEDICATIONS ORDERED IN ED: Medications  sodium chloride 0.9 % bolus 1,000 mL (0 mLs Intravenous Stopped 08/18/23 1440)  morphine (PF) 4 MG/ML injection 4 mg (4 mg Intravenous Given 08/18/23 1345)  ondansetron (ZOFRAN) injection 4 mg (4 mg Intravenous Given 08/18/23 1346)  iohexol (OMNIPAQUE) 300 MG/ML solution 60 mL (60 mLs Intravenous Contrast Given 08/18/23 1327)  pantoprazole (PROTONIX) injection 40 mg (40 mg Intravenous Given 08/18/23 1707)  ondansetron (ZOFRAN) injection 4 mg (4 mg Intravenous Given 08/18/23 1708)  ketorolac (TORADOL) 15 MG/ML injection 15 mg (15 mg Intravenous Given 08/18/23 1709)  ciprofloxacin (CIPRO) tablet 500 mg (500 mg Oral Given 08/18/23 1819)  metroNIDAZOLE (FLAGYL) tablet 500 mg (500 mg Oral Given 08/18/23 1819)     IMPRESSION /  MDM / ASSESSMENT AND PLAN / ED COURSE  I reviewed the triage vital signs and the nursing notes.  DDx: Diverticulitis, bowel perforation, bowel obstruction, AKI, electrolyte abnormality, UTI, pancreatitis  Patient's presentation is most consistent with acute presentation with potential threat to life or bodily function.  Patient presents with nausea vomiting and abdominal pain, labs are reassuring.  Will give IV fluids, morphine and obtain CT.   ----------------------------------------- 6:26 PM on 08/18/2023 ----------------------------------------- CT reveals mild sigmoid diverticulitis.  Patient tolerating oral intake, given oral antibiotics.  Stable for discharge      FINAL CLINICAL IMPRESSION(S) / ED DIAGNOSES   Final diagnoses:  Sigmoid diverticulitis     Rx / DC Orders   ED Discharge Orders          Ordered    ciprofloxacin (CIPRO) 500 MG tablet  2 times daily        08/18/23 1825    metroNIDAZOLE (FLAGYL)  500 MG tablet  3 times daily        08/18/23 1825    ondansetron (ZOFRAN-ODT) 4 MG disintegrating tablet  Every 8 hours PRN        08/18/23 1825    oxyCODONE-acetaminophen (PERCOCET) 5-325 MG tablet  Every 6 hours PRN        08/18/23 1825             Note:  This document was prepared using Dragon voice recognition software and may include unintentional dictation errors.   Sharman Cheek, MD 08/18/23 1827

## 2023-08-30 ENCOUNTER — Encounter: Payer: Self-pay | Admitting: Emergency Medicine

## 2023-08-30 ENCOUNTER — Other Ambulatory Visit: Payer: Self-pay

## 2023-08-30 DIAGNOSIS — R109 Unspecified abdominal pain: Secondary | ICD-10-CM | POA: Diagnosis present

## 2023-08-30 DIAGNOSIS — Z5321 Procedure and treatment not carried out due to patient leaving prior to being seen by health care provider: Secondary | ICD-10-CM | POA: Diagnosis not present

## 2023-08-30 DIAGNOSIS — R197 Diarrhea, unspecified: Secondary | ICD-10-CM | POA: Insufficient documentation

## 2023-08-30 DIAGNOSIS — R110 Nausea: Secondary | ICD-10-CM | POA: Diagnosis not present

## 2023-08-30 LAB — COMPREHENSIVE METABOLIC PANEL
ALT: 15 U/L (ref 0–44)
AST: 17 U/L (ref 15–41)
Albumin: 3.2 g/dL — ABNORMAL LOW (ref 3.5–5.0)
Alkaline Phosphatase: 94 U/L (ref 38–126)
Anion gap: 8 (ref 5–15)
BUN: 16 mg/dL (ref 8–23)
CO2: 25 mmol/L (ref 22–32)
Calcium: 8.3 mg/dL — ABNORMAL LOW (ref 8.9–10.3)
Chloride: 103 mmol/L (ref 98–111)
Creatinine, Ser: 0.82 mg/dL (ref 0.44–1.00)
GFR, Estimated: >60 mL/min (ref >60)
Glucose, Bld: 135 mg/dL — ABNORMAL HIGH (ref 70–99)
Potassium: 4 mmol/L (ref 3.5–5.1)
Sodium: 136 mmol/L (ref 135–145)
Total Bilirubin: 0.3 mg/dL (ref 0.3–1.2)
Total Protein: 7 g/dL (ref 6.5–8.1)

## 2023-08-30 LAB — CBC
HCT: 34.1 % — ABNORMAL LOW (ref 36.0–46.0)
Hemoglobin: 11.2 g/dL — ABNORMAL LOW (ref 12.0–15.0)
MCH: 30.9 pg (ref 26.0–34.0)
MCHC: 32.8 g/dL (ref 30.0–36.0)
MCV: 93.9 fL (ref 80.0–100.0)
Platelets: 197 10*3/uL (ref 150–400)
RBC: 3.63 MIL/uL — ABNORMAL LOW (ref 3.87–5.11)
RDW: 13.9 % (ref 11.5–15.5)
WBC: 7.2 10*3/uL (ref 4.0–10.5)
nRBC: 0 % (ref 0.0–0.2)

## 2023-08-30 LAB — LIPASE, BLOOD: Lipase: 30 U/L (ref 11–51)

## 2023-08-30 NOTE — ED Triage Notes (Signed)
Pt presents with abdominal pain, nausea, and diarrhea.  States she had a dx of diverticulitis last week and completed the meds but doesn't think it went away.

## 2023-08-31 ENCOUNTER — Emergency Department
Admission: EM | Admit: 2023-08-31 | Discharge: 2023-08-31 | Discharge status: Against Medical Advice | Payer: No Typology Code available for payment source | Attending: Student in an Organized Health Care Education/Training Program | Admitting: Student in an Organized Health Care Education/Training Program

## 2023-09-01 ENCOUNTER — Emergency Department
Admission: EM | Admit: 2023-09-01 | Discharge: 2023-09-01 | Disposition: A | Discharge status: Home / Self Care | Payer: No Typology Code available for payment source | Attending: Emergency Medicine | Admitting: Emergency Medicine

## 2023-09-01 ENCOUNTER — Encounter: Payer: Self-pay | Admitting: Emergency Medicine

## 2023-09-01 ENCOUNTER — Emergency Department: Payer: No Typology Code available for payment source

## 2023-09-01 ENCOUNTER — Other Ambulatory Visit: Payer: Self-pay

## 2023-09-01 DIAGNOSIS — R109 Unspecified abdominal pain: Secondary | ICD-10-CM | POA: Diagnosis present

## 2023-09-01 DIAGNOSIS — R1084 Generalized abdominal pain: Secondary | ICD-10-CM | POA: Diagnosis not present

## 2023-09-01 LAB — CBC
HCT: 36.7 % (ref 36.0–46.0)
Hemoglobin: 11.7 g/dL — ABNORMAL LOW (ref 12.0–15.0)
MCH: 30.8 pg (ref 26.0–34.0)
MCHC: 31.9 g/dL (ref 30.0–36.0)
MCV: 96.6 fL (ref 80.0–100.0)
Platelets: 225 10*3/uL (ref 150–400)
RBC: 3.8 MIL/uL — ABNORMAL LOW (ref 3.87–5.11)
RDW: 13.9 % (ref 11.5–15.5)
WBC: 6.8 10*3/uL (ref 4.0–10.5)
nRBC: 0 % (ref 0.0–0.2)

## 2023-09-01 LAB — COMPREHENSIVE METABOLIC PANEL
ALT: 13 U/L (ref 0–44)
AST: 18 U/L (ref 15–41)
Albumin: 3.3 g/dL — ABNORMAL LOW (ref 3.5–5.0)
Alkaline Phosphatase: 82 U/L (ref 38–126)
Anion gap: 6 (ref 5–15)
BUN: 18 mg/dL (ref 8–23)
CO2: 24 mmol/L (ref 22–32)
Calcium: 8.4 mg/dL — ABNORMAL LOW (ref 8.9–10.3)
Chloride: 106 mmol/L (ref 98–111)
Creatinine, Ser: 0.82 mg/dL (ref 0.44–1.00)
GFR, Estimated: >60 mL/min (ref >60)
Glucose, Bld: 97 mg/dL (ref 70–99)
Potassium: 4.3 mmol/L (ref 3.5–5.1)
Sodium: 136 mmol/L (ref 135–145)
Total Bilirubin: 0.4 mg/dL (ref 0.3–1.2)
Total Protein: 6.9 g/dL (ref 6.5–8.1)

## 2023-09-01 LAB — URINALYSIS, ROUTINE W REFLEX MICROSCOPIC
Bacteria, UA: NONE SEEN
Bilirubin Urine: NEGATIVE
Glucose, UA: NEGATIVE mg/dL
Ketones, ur: NEGATIVE mg/dL
Leukocytes,Ua: NEGATIVE
Nitrite: NEGATIVE
Protein, ur: NEGATIVE mg/dL
Specific Gravity, Urine: 1.017 (ref 1.005–1.030)
pH: 5 (ref 5.0–8.0)

## 2023-09-01 LAB — LIPASE, BLOOD: Lipase: 51 U/L (ref 11–51)

## 2023-09-01 MED ORDER — ONDANSETRON HCL 4 MG/2ML IJ SOLN
4.0000 mg | Freq: Once | INTRAMUSCULAR | Status: AC
  Administered 2023-09-01: 4 mg via INTRAVENOUS
  Filled 2023-09-01: qty 2

## 2023-09-01 MED ORDER — MORPHINE SULFATE (PF) 4 MG/ML IV SOLN
4.0000 mg | Freq: Once | INTRAVENOUS | Status: AC
  Administered 2023-09-01: 4 mg via INTRAVENOUS
  Filled 2023-09-01: qty 1

## 2023-09-01 MED ORDER — DICYCLOMINE HCL 10 MG PO CAPS
20.0000 mg | ORAL_CAPSULE | Freq: Once | ORAL | Status: AC
  Administered 2023-09-01: 20 mg via ORAL
  Filled 2023-09-01: qty 2

## 2023-09-01 MED ORDER — IOHEXOL 300 MG/ML  SOLN
60.0000 mL | Freq: Once | INTRAMUSCULAR | Status: AC | PRN
  Administered 2023-09-01: 60 mL via INTRAVENOUS

## 2023-09-01 MED ORDER — DICYCLOMINE HCL 10 MG PO CAPS: 10.0000 mg | ORAL_CAPSULE | Freq: Three times a day (TID) | ORAL | 0 refills | Status: DC | PRN

## 2023-09-01 NOTE — ED Provider Notes (Signed)
[facility] Provider Note    Event Date/Time   First MD Initiated Contact with Patient 09/01/23 1210     (approximate)  History   Chief Complaint: Abdominal Pain  HPI  [name] is a 67 y.o. female with a past medical history of diverticulitis, gastric reflux, presents emergency department for abdominal pain.  According to the patient approximately 2 to 3 weeks ago she was diagnosed with acute diverticulitis.  Patient finished a course of ciprofloxacin and Flagyl states she was feeling somewhat better and is now feeling worse.  States the pain is recurring same general area of the mid to lower abdomen.  Denies any urinary symptoms.  States chills but denies any measured temperature.  No vomiting or significant diarrhea.  Physical Exam   Triage Vital Signs: ED Triage Vitals  Encounter Vitals Group     BP 09/01/23 1158 (!) 132/58     Systolic BP Percentile --      Diastolic BP Percentile --      Pulse Rate 09/01/23 1158 72     Resp 09/01/23 1158 20     Temp 09/01/23 1158 98.6 F (37 C)     Temp Source 09/01/23 1158 Oral     SpO2 09/01/23 1158 100 %     Weight 09/01/23 1155 90 lb (40.8 kg)     Height 09/01/23 1155 5\' 3"  (1.6 m)     Head Circumference --      Peak Flow --      Pain Score 09/01/23 1155 9     Pain Loc --      Pain Education --      Exclude from Growth Chart --     Most recent vital signs: Vitals:   09/01/23 1158  BP: (!) 132/58  Pulse: 72  Resp: 20  Temp: 98.6 F (37 C)  SpO2: 100%    General: Awake, no distress.  CV:  Good peripheral perfusion.  Regular rate and rhythm  Resp:  Normal effort.  Equal breath sounds bilaterally.  Abd:  No distention.  Soft, mild diffuse tenderness without focal tenderness identified.  No rebound or guarding.  ED Results / Procedures / Treatments   RADIOLOGY  I have reviewed and interpreted the CT images I do not see any obvious obstruction or significant abnormality. Radiology is read  the x-ray is negative for acute finding.  MEDICATIONS ORDERED IN ED: Medications  morphine (PF) 4 MG/ML injection 4 mg (has no administration in time range)  ondansetron (ZOFRAN) injection 4 mg (has no administration in time range)     IMPRESSION / MDM / ASSESSMENT AND PLAN / ED COURSE  I reviewed the triage vital signs and the nursing notes.  Patient's presentation is most consistent with acute presentation with potential threat to life or bodily function.  Patient presents the emergency department for abdominal pain.  Overall the patient appears well, no distress.  Patient does have mild diffuse abdominal tenderness.  States she was diagnosed with diverticulitis 2 weeks ago has finished her course of antibiotics and now the pain is recurring.  Patient's lab work is reassuring occluding a normal chemistry including LFTs and lipase.  Reassuring CBC with a normal white blood cell count and the patient's urinalysis shows no acute finding.  We will proceed with a repeat CT scan to evaluate for recurrent diverticulitis perforation or abscess.  Will treat pain and nausea while awaiting results.  Patient agreeable to plan.  Patient CT scan is  read as negative by radiology.  Will discharge from the emergency department with PCP follow-up.  Final diagnosis: Abdominal pain  Note:  This document was prepared using Dragon voice recognition software and may include unintentional dictation errors.   [name], MD 09/01/23 1624

## 2023-09-01 NOTE — ED Notes (Signed)
Patient states she took the Cipro and Flagyl as ordered and finished them. Patient states she has had an increased in passing gas, burping, is nauseous and now has a headache. Patient states she had a " large amount " of diarrhea yesterday and had a small loose stool today.

## 2023-09-01 NOTE — ED Notes (Signed)
Pt up to use bathroom. Urine already sent. States pain slightly lower.

## 2023-09-01 NOTE — ED Triage Notes (Addendum)
Pt via POV from home. Pt c/o generalized abd pain that started a week ago, reports she was here and LWBS on 10/24. Pt was dx with diverticulitis but states that medications that were prescribed did not help. Reports last BM was this AM. Pt is A&Ox4 and NAD, ambulatory on arrival.

## 2023-09-18 ENCOUNTER — Emergency Department
Admission: EM | Admit: 2023-09-18 | Discharge: 2023-09-18 | Disposition: A | Discharge status: Home / Self Care | Payer: No Typology Code available for payment source | Attending: Emergency Medicine | Admitting: Emergency Medicine

## 2023-09-18 ENCOUNTER — Encounter: Payer: Self-pay | Admitting: Emergency Medicine

## 2023-09-18 DIAGNOSIS — R1013 Epigastric pain: Secondary | ICD-10-CM | POA: Diagnosis present

## 2023-09-18 DIAGNOSIS — K2100 Gastro-esophageal reflux disease with esophagitis, without bleeding: Secondary | ICD-10-CM | POA: Insufficient documentation

## 2023-09-18 LAB — COMPREHENSIVE METABOLIC PANEL
ALT: 7 U/L (ref 0–44)
AST: 15 U/L (ref 15–41)
Albumin: 3.8 g/dL (ref 3.5–5.0)
Alkaline Phosphatase: 82 U/L (ref 38–126)
Anion gap: 11 (ref 5–15)
BUN: 23 mg/dL (ref 8–23)
CO2: 21 mmol/L — ABNORMAL LOW (ref 22–32)
Calcium: 8.8 mg/dL — ABNORMAL LOW (ref 8.9–10.3)
Chloride: 104 mmol/L (ref 98–111)
Creatinine, Ser: 0.94 mg/dL (ref 0.44–1.00)
GFR, Estimated: >60 mL/min (ref >60)
Glucose, Bld: 116 mg/dL — ABNORMAL HIGH (ref 70–99)
Potassium: 3.8 mmol/L (ref 3.5–5.1)
Sodium: 136 mmol/L (ref 135–145)
Total Bilirubin: 0.4 mg/dL (ref <1.2)
Total Protein: 7.7 g/dL (ref 6.5–8.1)

## 2023-09-18 LAB — CBC
HCT: 41.4 % (ref 36.0–46.0)
Hemoglobin: 13.2 g/dL (ref 12.0–15.0)
MCH: 30.7 pg (ref 26.0–34.0)
MCHC: 31.9 g/dL (ref 30.0–36.0)
MCV: 96.3 fL (ref 80.0–100.0)
Platelets: 228 10*3/uL (ref 150–400)
RBC: 4.3 MIL/uL (ref 3.87–5.11)
RDW: 13.3 % (ref 11.5–15.5)
WBC: 7.3 10*3/uL (ref 4.0–10.5)
nRBC: 0 % (ref 0.0–0.2)

## 2023-09-18 LAB — URINALYSIS, ROUTINE W REFLEX MICROSCOPIC
Bacteria, UA: NONE SEEN
Bilirubin Urine: NEGATIVE
Glucose, UA: NEGATIVE mg/dL
Ketones, ur: NEGATIVE mg/dL
Leukocytes,Ua: NEGATIVE
Nitrite: NEGATIVE
Protein, ur: NEGATIVE mg/dL
Specific Gravity, Urine: 1.018 (ref 1.005–1.030)
pH: 5 (ref 5.0–8.0)

## 2023-09-18 LAB — LIPASE, BLOOD: Lipase: 39 U/L (ref 11–51)

## 2023-09-18 LAB — TROPONIN I (HIGH SENSITIVITY): Troponin I (High Sensitivity): 4 ng/L (ref <18)

## 2023-09-18 MED ORDER — SUCRALFATE 1 G PO TABS
1.0000 g | ORAL_TABLET | Freq: Once | ORAL | Status: AC
  Administered 2023-09-18: 1 g via ORAL
  Filled 2023-09-18: qty 1

## 2023-09-18 MED ORDER — ALUM & MAG HYDROXIDE-SIMETH 200-200-20 MG/5ML PO SUSP
30.0000 mL | Freq: Once | ORAL | Status: AC
  Administered 2023-09-18: 30 mL via ORAL
  Filled 2023-09-18: qty 30

## 2023-09-18 MED ORDER — SUCRALFATE 1 G PO TABS: 1.0000 g | ORAL_TABLET | Freq: Three times a day (TID) | ORAL | 0 refills | Status: DC

## 2023-09-18 MED ORDER — TRAMADOL HCL 50 MG PO TABS
50.0000 mg | ORAL_TABLET | Freq: Once | ORAL | Status: AC
  Administered 2023-09-18: 50 mg via ORAL
  Filled 2023-09-18: qty 1

## 2023-09-18 MED ORDER — ESOMEPRAZOLE MAGNESIUM 40 MG PO CPDR
40.0000 mg | DELAYED_RELEASE_CAPSULE | Freq: Two times a day (BID) | ORAL | 0 refills | Status: DC
Start: 2023-09-18 — End: 2023-10-24

## 2023-09-18 MED ORDER — PROMETHAZINE HCL 25 MG PO TABS
25.0000 mg | ORAL_TABLET | Freq: Once | ORAL | Status: AC
  Administered 2023-09-18: 25 mg via ORAL
  Filled 2023-09-18: qty 1

## 2023-09-18 MED ORDER — PROMETHAZINE HCL 12.5 MG PO TABS: 12.5000 mg | ORAL_TABLET | Freq: Four times a day (QID) | ORAL | 0 refills | Status: DC | PRN

## 2023-09-18 MED ORDER — LIDOCAINE VISCOUS HCL 2 % MT SOLN
15.0000 mL | Freq: Once | OROMUCOSAL | Status: AC
  Administered 2023-09-18: 15 mL via ORAL
  Filled 2023-09-18: qty 15

## 2023-09-18 MED ORDER — ONDANSETRON 4 MG PO TBDP
4.0000 mg | ORAL_TABLET | Freq: Once | ORAL | Status: AC
  Administered 2023-09-18: 4 mg via ORAL
  Filled 2023-09-18: qty 1

## 2023-09-18 NOTE — ED Triage Notes (Signed)
Pt reports she has GERD and been on protonix for years. Feels like no longer working. She has belching, nausea upon eating, and pain in epigastric area. States it comes up esophagus. She states this has been going on since 31st and it did improve. Did not follow up with GI because it was getting better. Now it has come back and she has not been able to sleep and unable to go to work.  PT denies actual vomiting or diarrhea or bloody tarry stools.

## 2023-09-18 NOTE — ED Provider Notes (Signed)
[facility] Provider Note    Event Date/Time   First MD Initiated Contact with Patient 09/18/23 2011     (approximate)   History   Abdominal Pain   HPI  [name] is a 67 y.o. female with a history of GERD, IBS who presents with epigastric discomfort, mild burning sensation, frequent belching.  She reports that this is developed over the last several days and is very consistent with her history of GERD, she reports taking Protonix for many years.  Review of records demonstrates patient has been treated with antibiotics for diverticulitis in October     Physical Exam   Triage Vital Signs: ED Triage Vitals  Encounter Vitals Group     BP 09/18/23 1918 (!) 174/69     Systolic BP Percentile --      Diastolic BP Percentile --      Pulse Rate 09/18/23 1918 93     Resp 09/18/23 1918 18     Temp 09/18/23 1918 98.4 F (36.9 C)     Temp Source 09/18/23 1918 Oral     SpO2 09/18/23 1918 94 %     Weight --      Height --      Head Circumference --      Peak Flow --      Pain Score 09/18/23 1917 9     Pain Loc --      Pain Education --      Exclude from Growth Chart --     Most recent vital signs: Vitals:   09/18/23 1918  BP: (!) 174/69  Pulse: 93  Resp: 18  Temp: 98.4 F (36.9 C)  SpO2: 94%     General: Awake, no distress.  CV:  Good peripheral perfusion.  Resp:  Normal effort.  Abd:  No distention.  Other:     ED Results / Procedures / Treatments   Labs (all labs ordered are listed, but only abnormal results are displayed) Labs Reviewed  COMPREHENSIVE METABOLIC PANEL - Abnormal; Notable for the following components:      Result Value   CO2 21 (*)    Glucose, Bld 116 (*)    Calcium 8.8 (*)    All other components within normal limits  URINALYSIS, ROUTINE W REFLEX MICROSCOPIC - Abnormal; Notable for the following components:   Color, Urine YELLOW (*)    APPearance CLEAR (*)    Hgb urine dipstick MODERATE (*)    All other  components within normal limits  LIPASE, BLOOD  CBC  TROPONIN I (HIGH SENSITIVITY)  TROPONIN I (HIGH SENSITIVITY)     EKG  ED ECG REPORT I, Jene Every, the attending physician, personally viewed and interpreted this ECG.  Date: 09/18/2023  Rhythm: normal sinus rhythm QRS Axis: normal Intervals: normal ST/T Wave abnormalities: normal Narrative Interpretation: no evidence of acute ischemia    RADIOLOGY Reviewed CT scan performed on October 26    PROCEDURES:  Critical Care performed:   Procedures   MEDICATIONS ORDERED IN ED: Medications  ondansetron (ZOFRAN-ODT) disintegrating tablet 4 mg (4 mg Oral Given 09/18/23 2039)  alum & mag hydroxide-simeth (MAALOX/MYLANTA) 200-200-20 MG/5ML suspension 30 mL (30 mLs Oral Given 09/18/23 2040)    And  lidocaine (XYLOCAINE) 2 % viscous mouth solution 15 mL (15 mLs Oral Given 09/18/23 2040)  sucralfate (CARAFATE) tablet 1 g (1 g Oral Given 09/18/23 2121)  promethazine (PHENERGAN) tablet 25 mg (25 mg Oral Given 09/18/23 2121)  traMADol (ULTRAM) tablet 50  mg (50 mg Oral Given 09/18/23 2129)     IMPRESSION / MDM / ASSESSMENT AND PLAN / ED COURSE  I reviewed the triage vital signs and the nursing notes. Patient's presentation is most consistent with acute complicated illness / injury requiring diagnostic workup.   Patient presents with primary complaint of epigastric discomfort with frequent belching, burning sensation.  Differential includes GERD, gastritis, PUD, esophagitis.  Not consistent with ACS  EKG, high sensitive troponin are normal  Work overall unremarkable.  Lipase and lfts normal  Treated with GI cocktail, Carafate, Phenergan and Zofran with improvement but not resolution.  Discussed with her that she will need close GI follow-up for endoscopy and further workup  No indication for admission at this time  Return precautions discussed      FINAL CLINICAL IMPRESSION(S) / ED DIAGNOSES   Final diagnoses:   Gastroesophageal reflux disease with esophagitis without hemorrhage     Rx / DC Orders   ED Discharge Orders          Ordered    Ambulatory referral to Gastroenterology        09/18/23 2113    promethazine (PHENERGAN) 12.5 MG tablet  Every 6 hours PRN        09/18/23 2113    esomeprazole (NEXIUM) 40 MG capsule  2 times daily before meals        09/18/23 2113    sucralfate (CARAFATE) 1 g tablet  3 times daily with meals & bedtime        09/18/23 2113             Note:  This document was prepared using Dragon voice recognition software and may include unintentional dictation errors.   Jene Every, MD 09/18/23 2140

## 2023-09-24 ENCOUNTER — Encounter: Payer: Self-pay | Admitting: Gynecologic Oncology

## 2023-09-25 ENCOUNTER — Telehealth: Payer: Self-pay

## 2023-09-25 NOTE — Telephone Encounter (Signed)
Pt states in MyChart message she found "lumps on the left side of where lymph nodes were removed. She is requesting an appointment  LVM for patient to call office regarding more information on the Mychart message she sent to Dr. Pricilla Holm.

## 2023-09-26 NOTE — Telephone Encounter (Signed)
Voicemail left for patient to call office regarding her recent MyChart message

## 2023-10-14 ENCOUNTER — Emergency Department
Admission: EM | Admit: 2023-10-14 | Discharge: 2023-10-14 | Disposition: A | Discharge status: Home / Self Care | Payer: No Typology Code available for payment source | Attending: Emergency Medicine | Admitting: Emergency Medicine

## 2023-10-14 ENCOUNTER — Emergency Department: Payer: No Typology Code available for payment source

## 2023-10-14 ENCOUNTER — Encounter: Payer: Self-pay | Admitting: Emergency Medicine

## 2023-10-14 ENCOUNTER — Other Ambulatory Visit: Payer: Self-pay

## 2023-10-14 DIAGNOSIS — R112 Nausea with vomiting, unspecified: Secondary | ICD-10-CM | POA: Diagnosis not present

## 2023-10-14 DIAGNOSIS — R1011 Right upper quadrant pain: Secondary | ICD-10-CM | POA: Insufficient documentation

## 2023-10-14 DIAGNOSIS — R1032 Left lower quadrant pain: Secondary | ICD-10-CM | POA: Diagnosis not present

## 2023-10-14 DIAGNOSIS — R1012 Left upper quadrant pain: Secondary | ICD-10-CM | POA: Insufficient documentation

## 2023-10-14 DIAGNOSIS — R1031 Right lower quadrant pain: Secondary | ICD-10-CM | POA: Diagnosis not present

## 2023-10-14 DIAGNOSIS — R197 Diarrhea, unspecified: Secondary | ICD-10-CM | POA: Insufficient documentation

## 2023-10-14 DIAGNOSIS — R1084 Generalized abdominal pain: Secondary | ICD-10-CM

## 2023-10-14 DIAGNOSIS — R1010 Upper abdominal pain, unspecified: Secondary | ICD-10-CM | POA: Diagnosis present

## 2023-10-14 LAB — COMPREHENSIVE METABOLIC PANEL
ALT: 11 U/L (ref 0–44)
AST: 18 U/L (ref 15–41)
Albumin: 3.6 g/dL (ref 3.5–5.0)
Alkaline Phosphatase: 85 U/L (ref 38–126)
Anion gap: 5 (ref 5–15)
BUN: 18 mg/dL (ref 8–23)
CO2: 25 mmol/L (ref 22–32)
Calcium: 8.2 mg/dL — ABNORMAL LOW (ref 8.9–10.3)
Chloride: 106 mmol/L (ref 98–111)
Creatinine, Ser: 0.79 mg/dL (ref 0.44–1.00)
GFR, Estimated: >60 mL/min (ref >60)
Glucose, Bld: 171 mg/dL — ABNORMAL HIGH (ref 70–99)
Potassium: 4.3 mmol/L (ref 3.5–5.1)
Sodium: 136 mmol/L (ref 135–145)
Total Bilirubin: 0.5 mg/dL (ref <1.2)
Total Protein: 7.6 g/dL (ref 6.5–8.1)

## 2023-10-14 LAB — URINALYSIS, ROUTINE W REFLEX MICROSCOPIC
Bacteria, UA: NONE SEEN
Bilirubin Urine: NEGATIVE
Glucose, UA: NEGATIVE mg/dL
Ketones, ur: NEGATIVE mg/dL
Leukocytes,Ua: NEGATIVE
Nitrite: NEGATIVE
Protein, ur: NEGATIVE mg/dL
Specific Gravity, Urine: 1.019 (ref 1.005–1.030)
pH: 5 (ref 5.0–8.0)

## 2023-10-14 LAB — CBC
HCT: 40.3 % (ref 36.0–46.0)
Hemoglobin: 13.3 g/dL (ref 12.0–15.0)
MCH: 30.9 pg (ref 26.0–34.0)
MCHC: 33 g/dL (ref 30.0–36.0)
MCV: 93.7 fL (ref 80.0–100.0)
Platelets: 211 10*3/uL (ref 150–400)
RBC: 4.3 MIL/uL (ref 3.87–5.11)
RDW: 13 % (ref 11.5–15.5)
WBC: 7.5 10*3/uL (ref 4.0–10.5)
nRBC: 0 % (ref 0.0–0.2)

## 2023-10-14 LAB — LIPASE, BLOOD: Lipase: 29 U/L (ref 11–51)

## 2023-10-14 MED ORDER — SODIUM CHLORIDE 0.9 % IV BOLUS
1000.0000 mL | Freq: Once | INTRAVENOUS | Status: AC
  Administered 2023-10-14: 1000 mL via INTRAVENOUS

## 2023-10-14 MED ORDER — PROMETHAZINE HCL 12.5 MG PO TABS: 12.5000 mg | ORAL_TABLET | Freq: Four times a day (QID) | ORAL | 0 refills | Status: AC | PRN

## 2023-10-14 MED ORDER — DROPERIDOL 2.5 MG/ML IJ SOLN
2.5000 mg | Freq: Once | INTRAMUSCULAR | Status: AC
  Administered 2023-10-14: 2.5 mg via INTRAVENOUS
  Filled 2023-10-14: qty 2

## 2023-10-14 MED ORDER — IOHEXOL 300 MG/ML  SOLN
75.0000 mL | Freq: Once | INTRAMUSCULAR | Status: AC | PRN
Start: 2023-10-14 — End: 2023-10-14
  Administered 2023-10-14: 75 mL via INTRAVENOUS

## 2023-10-14 MED ORDER — MORPHINE SULFATE (PF) 2 MG/ML IV SOLN
2.0000 mg | Freq: Once | INTRAVENOUS | Status: AC
  Administered 2023-10-14: 2 mg via INTRAVENOUS
  Filled 2023-10-14: qty 1

## 2023-10-14 MED ORDER — OXYCODONE HCL 5 MG PO TABS: 5.0000 mg | ORAL_TABLET | Freq: Three times a day (TID) | ORAL | 0 refills | Status: DC | PRN

## 2023-10-14 NOTE — ED Triage Notes (Signed)
C?o abdominal pain and vomiting.  States has history of GERD And has appointment with GI on 12/18, but pain and vomiting has been worse.  STates this is an ongoing problem.

## 2023-10-14 NOTE — ED Provider Notes (Signed)
[facility] Provider Note    Event Date/Time   First MD Initiated Contact with Patient 10/14/23 1033     (approximate)   History   Abdominal Pain   HPI  [name] is a 67 y.o. female   Past medical history of irritable bowel syndrome, history of cholecystectomy, abdominal hysterectomy, who presents with ongoing nausea vomiting and new onset diarrhea with abdominal cramping pain.  This been going on for months.  She has an upcoming GI appointment next week.  She developed loose stools which is a new symptom today.  No bleeding.  She has diffuse abdominal pain mostly in the upper quadrants but some in the lower quadrants as well.  She has had a history of diverticulitis.  She denies any urinary symptoms.   External Medical Documents Reviewed: Prior emergency department visits in October and November for GI symptoms with negative CT scans, questionable diverticulitis on the scan on 08/18/2023       Physical Exam   Triage Vital Signs: ED Triage Vitals  Encounter Vitals Group     BP 10/14/23 0946 128/65     Systolic BP Percentile --      Diastolic BP Percentile --      Pulse Rate 10/14/23 0946 88     Resp 10/14/23 0946 16     Temp 10/14/23 0946 98 F (36.7 C)     Temp Source 10/14/23 0946 Oral     SpO2 10/14/23 0946 94 %     Weight 10/14/23 0947 89 lb 15.2 oz (40.8 kg)     Height 10/14/23 1033 5\' 3"  (1.6 m)     Head Circumference --      Peak Flow --      Pain Score 10/14/23 0947 8     Pain Loc --      Pain Education --      Exclude from Growth Chart --     Most recent vital signs: Vitals:   10/14/23 0946 10/14/23 1247  BP: 128/65 120/60  Pulse: 88 80  Resp: 16 16  Temp: 98 F (36.7 C)   SpO2: 94% 97%    General: Awake, no distress.  CV:  Good peripheral perfusion.  Resp:  Normal effort.  Abd:  No distention.  Other:  Normal vital signs, diffuse tenderness to palpation in all quadrants, but no rigidity or guarding.  No  fever.   ED Results / Procedures / Treatments   Labs (all labs ordered are listed, but only abnormal results are displayed) Labs Reviewed  COMPREHENSIVE METABOLIC PANEL - Abnormal; Notable for the following components:      Result Value   Glucose, Bld 171 (*)    Calcium 8.2 (*)    All other components within normal limits  URINALYSIS, ROUTINE W REFLEX MICROSCOPIC - Abnormal; Notable for the following components:   Color, Urine YELLOW (*)    APPearance CLEAR (*)    Hgb urine dipstick MODERATE (*)    All other components within normal limits  LIPASE, BLOOD  CBC     I ordered and reviewed the above labs they are notable for cell counts, electrolytes, urinalysis unremarkable.    RADIOLOGY I independently reviewed and interpreted CT of the abdomen pelvis and see a markedly distended air-filled stomach I also reviewed radiologist's formal read.   PROCEDURES:  Critical Care performed: No  Procedures   MEDICATIONS ORDERED IN ED: Medications  droperidol (INAPSINE) 2.5 MG/ML injection 2.5 mg (2.5 mg Intravenous Given  10/14/23 1140)  sodium chloride 0.9 % bolus 1,000 mL (0 mLs Intravenous Stopped 10/14/23 1245)  iohexol (OMNIPAQUE) 300 MG/ML solution 75 mL (75 mLs Intravenous Contrast Given 10/14/23 1157)  morphine (PF) 2 MG/ML injection 2 mg (2 mg Intravenous Given 10/14/23 1219)     IMPRESSION / MDM / ASSESSMENT AND PLAN / ED COURSE  I reviewed the triage vital signs and the nursing notes.                                Patient's presentation is most consistent with acute presentation with potential threat to life or bodily function.  Differential diagnosis includes, but is not limited to, IBS, viral gastroenteritis, infectious colitis, intra-abdominal infection, obstruction   The patient is on the cardiac monitor to evaluate for evidence of arrhythmia and/or significant heart rate changes.  MDM:    This patient with months long abdominal discomfort and nausea and  vomiting with new diarrheal illness today.  Diffuse tenderness in the abdomen with unremarkable CT scans but it has been 38-month interval we will recheck with a CT scan today.  Symptomatic control with IV morphine and droperidol, IV fluids.  She feels markedly improved.  CT scan with no acute intra-abdominal pathologies.  Discharged with Phenergan prescription and GI follow-up.       FINAL CLINICAL IMPRESSION(S) / ED DIAGNOSES   Final diagnoses:  Nausea vomiting and diarrhea  Generalized abdominal pain     Rx / DC Orders   ED Discharge Orders          Ordered    oxyCODONE (ROXICODONE) 5 MG immediate release tablet  Every 8 hours PRN        10/14/23 1235    promethazine (PHENERGAN) 12.5 MG tablet  Every 6 hours PRN        10/14/23 1235             Note:  This document was prepared using Dragon voice recognition software and may include unintentional dictation errors.    Pilar Jarvis, MD 10/14/23 1314

## 2023-10-14 NOTE — ED Notes (Signed)
See triage note  Presents with some n/v. States this started couple of days ago  No fever

## 2023-10-22 DIAGNOSIS — H0264 Xanthelasma of left upper eyelid: Secondary | ICD-10-CM | POA: Insufficient documentation

## 2023-10-22 DIAGNOSIS — F411 Generalized anxiety disorder: Secondary | ICD-10-CM | POA: Insufficient documentation

## 2023-10-22 DIAGNOSIS — F339 Major depressive disorder, recurrent, unspecified: Secondary | ICD-10-CM | POA: Insufficient documentation

## 2023-10-22 DIAGNOSIS — K219 Gastro-esophageal reflux disease without esophagitis: Secondary | ICD-10-CM | POA: Insufficient documentation

## 2023-10-22 HISTORY — DX: Xanthelasma of left upper eyelid: H02.64

## 2023-10-22 HISTORY — DX: Generalized anxiety disorder: F41.1

## 2023-10-22 HISTORY — DX: Gastro-esophageal reflux disease without esophagitis: K21.9

## 2023-10-22 HISTORY — DX: Major depressive disorder, recurrent, unspecified: F33.9

## 2023-10-24 ENCOUNTER — Encounter: Payer: Self-pay | Admitting: Physician Assistant

## 2023-10-24 ENCOUNTER — Ambulatory Visit (INDEPENDENT_AMBULATORY_CARE_PROVIDER_SITE_OTHER): Payer: No Typology Code available for payment source | Admitting: Physician Assistant

## 2023-10-24 VITALS — BP 142/58 | HR 84 | Temp 98.3°F | Ht 63.0 in | Wt 110.2 lb

## 2023-10-24 DIAGNOSIS — K5904 Chronic idiopathic constipation: Secondary | ICD-10-CM

## 2023-10-24 DIAGNOSIS — K219 Gastro-esophageal reflux disease without esophagitis: Secondary | ICD-10-CM

## 2023-10-24 DIAGNOSIS — K581 Irritable bowel syndrome with constipation: Secondary | ICD-10-CM

## 2023-10-24 DIAGNOSIS — Z1211 Encounter for screening for malignant neoplasm of colon: Secondary | ICD-10-CM

## 2023-10-24 DIAGNOSIS — R1013 Epigastric pain: Secondary | ICD-10-CM

## 2023-10-24 MED ORDER — PEG 3350-KCL-NA BICARB-NACL 420 G PO SOLR: 4000.0000 mL | Freq: Once | ORAL | 0 refills | Status: AC

## 2023-10-24 MED ORDER — LINACLOTIDE 290 MCG PO CAPS: 290.0000 ug | ORAL_CAPSULE | Freq: Every day | ORAL | 3 refills | Status: AC

## 2023-10-24 MED ORDER — PANTOPRAZOLE SODIUM 40 MG PO TBEC
40.0000 mg | DELAYED_RELEASE_TABLET | Freq: Every day | ORAL | 3 refills | Status: AC
Start: 2023-10-24 — End: ?

## 2023-10-24 NOTE — Progress Notes (Signed)
Celso Amy, PA-C [address]  [address]  [place], [place] [address]  Main: [phone]  Fax: [phone]   Gastroenterology Consultation  Referring Provider:     Jene Every, MD Primary Care Physician:  Patient, No Pcp Per Primary Gastroenterologist:  Celso Amy, PA-C  Reason for Consultation:     Chronic GERD, chronic constipation        HPI:   [name] is a 67 y.o. y/o female referred for consultation & management  by Patient, No Pcp Per.  History of chronic GERD and chronic constipation for over 20 years.  Has seen multiple GI specialists.  She was on Nexium 40 Mg twice daily, Linzess 290 daily, dicyclomine 10 Mg 3 times daily as needed.  Currently she is not taking any GI medications.  She states she has tried multiple PPIs including pantoprazole, Nexium, and omeprazole twice daily which did not control her reflux.  She is out of Linzess 290.  She reports generalized abdominal pain throughout her entire abdomen.  Burning in the epigastrium with episodes of heartburn and acid reflux and belching.  Has episodes of constipation alternating with diarrhea.  Diagnosed with IBS in the past.  10/14/2023 CT abdomen and pelvis with contrast: to evaluate abdominal pain and vomiting, showed no acute abnormality or masses.  Stable 3 cm infrarenal abdominal aortic aneurysm.  Repeat ultrasound every 3 years.  Stable 1.8 cm right common iliac artery aneurysm.  Previous cholecystectomy.  Diverticulosis but no diverticulitis.  06/2023: Underwent fecal disimpaction.  Previously saw Dr. Norma Fredrickson at [facility] between 2015 and 2017 for GERD.  Saw Dr. Leone Payor at [facility] in 2005 and 2011.  2015: CT abdomen unremarkable.  EGD with Bravo study normal except small hiatal hernia.  Bravo study showed adequate acid suppression on PPI.  Barium swallow test normal.  GES in 2013 was normal.  Negative brain MRI in 2014.  06/2011: Normal colonoscopy at [facility]?  01/2010  colonoscopy by Dr. Leone Payor: Sigmoid diverticulosis, otherwise normal. 01/2010 EGD by Dr. Leone Payor: Gastritis; biopsies negative for H. pylori.  No family history of colon cancer.   Past Medical History:  Diagnosis Date   Chronic left-sided low back pain with left-sided sciatica 03/07/2016   Colon cancer screening 01/04/2016   Diverticulitis    Environmental allergies    Epigastric pain 01/04/2016   Gastroesophageal reflux disease without esophagitis 10/22/2023   Generalized anxiety disorder 10/22/2023   GERD (gastroesophageal reflux disease)    Hyperlipidemia 03/07/2016   IBS (irritable bowel syndrome)    Insomnia 03/07/2016   Irritable bowel syndrome with constipation 01/04/2016   Lumbar radiculitis 09/20/2018   Primary vulvar squamous cell carcinoma (HCC) 10/20/2021   Added automatically from request for surgery [id number]     Recurrent major depressive disorder (HCC) 10/22/2023   Sensorineural hearing loss (SNHL) of right ear with restricted hearing of left ear 03/11/2019   SI (sacroiliac) pain 01/15/2020   Spondylosis of lumbar region without myelopathy or radiculopathy 10/24/2019   Added automatically from request for surgery [id number]     Squamous cell carcinoma of skin 09/15/2021   L labia majora with lichen sclerosus - needs referral   Tinnitus, bilateral 03/11/2019   Xanthelasma of left upper eyelid 10/22/2023    Past Surgical History:  Procedure Laterality Date   ABDOMINAL HYSTERECTOMY     BLADDER SURGERY     CHOLECYSTECTOMY     FRACTURE SURGERY Left    ankle surgery   JOINT REPLACEMENT  TONSILLECTOMY      Prior to Admission medications   Medication Sig Start Date End Date Taking? Authorizing Provider  polyethylene glycol-electrolytes (NULYTELY) 420 g solution Take 4,000 mLs by mouth once for 1 dose. Use as directed for your colonoscopy preparation 10/24/23 10/24/23 Yes Celso Amy, PA-C  promethazine (PHENERGAN) 12.5 MG tablet Take 1 tablet (12.5 mg total) by  mouth every 6 (six) hours as needed for nausea or vomiting. 10/14/23  Yes Pilar Jarvis, MD  QUEtiapine Fumarate (SEROQUEL XR) 150 MG 24 hr tablet Take by mouth. 02/15/21  Yes [provider]  sertraline (ZOLOFT) 100 MG tablet Take 1 tablet by mouth daily. 02/15/21  Yes [provider]  simvastatin (ZOCOR) 20 MG tablet Take 20 mg by mouth at bedtime.     Yes [provider]     Family History  Problem Relation Age of Onset   Lung cancer Maternal Aunt      Social History   Tobacco Use   Smoking status: Every Day    Current packs/day: 0.50    Types: Cigarettes   Smokeless tobacco: Never  Vaping Use   Vaping status: Never Used  Substance Use Topics   Alcohol use: Yes    Comment: Occ   Drug use: No    Allergies as of 10/24/2023 - Review Complete 10/24/2023  Allergen Reaction Noted   Penicillins Nausea And Vomiting    Reglan [metoclopramide] Other (See Comments) 06/07/2012   Sulfonamide derivatives Other (See Comments)    Meperidine hcl Other (See Comments)     Review of Systems:    All systems reviewed and negative except where noted in HPI.   Physical Exam:  BP (!) 142/58   Pulse 84   Temp 98.3 F (36.8 C)   Ht 5\' 3"  (1.6 m)   Wt 110 lb 3.2 oz (50 kg)   BMI 19.52 kg/m  No LMP recorded. Patient has had a hysterectomy.  General:   Alert,  Well-developed, well-nourished, pleasant and cooperative in NAD Lungs:  Respirations even and unlabored.  Clear throughout to auscultation.   No wheezes, crackles, or rhonchi. No acute distress. Heart:  Regular rate and rhythm; no murmurs, clicks, rubs, or gallops. Abdomen:  Normal bowel sounds.  No bruits.  Soft, and non-distended without masses, hepatosplenomegaly or hernias noted.  Mild generalized tenderness throughout entire abdomen.   No guarding or rebound tenderness.    Neurologic:  Alert and oriented x3;  grossly normal neurologically. Psych:  Alert and cooperative. Normal mood and affect.  Imaging  Studies: CT ABDOMEN PELVIS W CONTRAST Result Date: 10/14/2023 CLINICAL DATA:  Abdominal pain and vomiting. EXAM: CT ABDOMEN AND PELVIS WITH CONTRAST TECHNIQUE: Multidetector CT imaging of the abdomen and pelvis was performed using the standard protocol following bolus administration of intravenous contrast. RADIATION DOSE REDUCTION: This exam was performed according to the departmental dose-optimization program which includes automated exposure control, adjustment of the mA and/or kV according to patient size and/or use of iterative reconstruction technique. CONTRAST:  75mL OMNIPAQUE IOHEXOL 300 MG/ML  SOLN COMPARISON:  CT scan 09/01/2023 FINDINGS: Lower chest: Stable basilar scarring changes. No infiltrates or effusions. No pericardial effusion. Hepatobiliary: No hepatic lesions are identified. Mild stable intrahepatic biliary dilatation likely due to prior cholecystectomy. No change since 2019. Pancreas: Unremarkable. No pancreatic ductal dilatation or surrounding inflammatory changes. Spleen: Normal in size without focal abnormality. Adrenals/Urinary Tract: The adrenal glands are normal. No worrisome renal lesions hydronephrosis. Stable simple nonenhancing 3 cm right renal cyst not requiring  any further imaging evaluation or follow-up. Stomach/Bowel: The stomach, duodenum, small bowel and colon are grossly normal without oral contrast. No inflammatory changes, mass lesions or obstructive findings. Stable colonic diverticulosis without findings for acute diverticulitis. Vascular/Lymphatic: Stable 3 cm infrarenal abdominal aortic aneurysm and age advanced atherosclerotic disease. Recommend follow-up ultrasound every 3 years. (Ref.: J Vasc Surg. 2018; 67:2-77 and J Am Coll Radiol 2013;10(10):789-794.) There is also a stable 1.8 cm right common iliac artery aneurysm. The major venous structures are patent. No abdominopelvic lymphadenopathy. Reproductive: Surgically absent. Other: No pelvic mass or adenopathy. No  free pelvic fluid collections. No inguinal mass or adenopathy. No abdominal wall hernia or subcutaneous lesions. Musculoskeletal: No significant bony findings. IMPRESSION: 1. No acute abdominal/pelvic findings, mass lesions or adenopathy. 2. Stable 3 cm infrarenal abdominal aortic aneurysm and age advanced atherosclerotic disease. Recommend follow-up ultrasound every 3 years. 3. Stable 1.8 cm right common iliac artery aneurysm. 4. Status post cholecystectomy with mild stable intrahepatic biliary dilatation. 5. Stable colonic diverticulosis without findings for acute diverticulitis. 6. Aortic atherosclerosis. Aortic Atherosclerosis (ICD10-I70.0). Electronically Signed   By: Rudie Meyer M.D.   On: 10/14/2023 12:21    Assessment and Plan:   SUMITRA DECKERT is a 67 y.o. y/o female has been referred for:  1.  Chronic GERD / Epigastric Pain  H. pylori breath test today.  Start Dexilant 60 mg once daily, #30, 5 refills.  She tried and failed high-dose pantoprazole, Nexium, & omeprazole.  Scheduling EGD I discussed risks of EGD with patient to include risk of bleeding, perforation, and risk of sedation.  Patient expressed understanding and agrees to proceed with EGD.   2.  Chronic constipation; Irritable bowel syndrome with constipation  Restart Linzess 290 mcg 1 capsule once daily, #90, 3 refills.  High-fiber diet, 60 ounces of fluids daily.  3.  Colon cancer screening  Scheduling Colonoscopy I discussed risks of colonoscopy with patient to include risk of bleeding, colon perforation, and risk of sedation.  Patient expressed understanding and agrees to proceed with colonoscopy.   Follow up in 3 months with TG.  Celso Amy, PA-C

## 2023-10-25 LAB — H. PYLORI BREATH TEST: H pylori Breath Test: NEGATIVE

## 2023-11-28 ENCOUNTER — Ambulatory Visit
Admission: RE | Admit: 2023-11-28 | Payer: No Typology Code available for payment source | Source: Home / Self Care | Admitting: Gastroenterology

## 2023-11-28 SURGERY — ESOPHAGOGASTRODUODENOSCOPY (EGD) WITH PROPOFOL
Anesthesia: General

## 2024-03-17 ENCOUNTER — Ambulatory Visit (INDEPENDENT_AMBULATORY_CARE_PROVIDER_SITE_OTHER): Admitting: Dermatology

## 2024-03-17 ENCOUNTER — Encounter: Payer: Self-pay | Admitting: Dermatology

## 2024-03-17 ENCOUNTER — Ambulatory Visit: Admitting: Dermatology

## 2024-03-17 DIAGNOSIS — L2081 Atopic neurodermatitis: Secondary | ICD-10-CM

## 2024-03-17 DIAGNOSIS — Z79899 Other long term (current) drug therapy: Secondary | ICD-10-CM

## 2024-03-17 DIAGNOSIS — L603 Nail dystrophy: Secondary | ICD-10-CM

## 2024-03-17 DIAGNOSIS — Z7189 Other specified counseling: Secondary | ICD-10-CM | POA: Diagnosis not present

## 2024-03-17 DIAGNOSIS — L209 Atopic dermatitis, unspecified: Secondary | ICD-10-CM

## 2024-03-17 MED ORDER — MOMETASONE FUROATE 0.1 % EX CREA: TOPICAL_CREAM | CUTANEOUS | 3 refills | Status: AC

## 2024-03-17 NOTE — Progress Notes (Signed)
   Follow-Up Visit   Subjective  [name] is a 68 y.o. female who presents for the following: abnormal nail growth. Left thumb. Scaly peeling skin. Patient admits to picking at area. Dur: several months.   The following portions of the chart were reviewed this encounter and updated as appropriate: medications, allergies, medical history  Review of Systems:  No other skin or systemic complaints except as noted in HPI or Assessment and Plan.  Objective  Well appearing patient in no apparent distress; mood and affect are within normal limits.  A focused examination was performed of the following areas: Left thumb   Relevant exam findings are noted in the Assessment and Plan.           Assessment & Plan   Eczematous reaction with nail dystrophy Pt admits to picking at area. Exam: Scaly peeling skin at medial proximal nail fold.  Treatment Plan: Start mometasone cream twice a day to affected area M-F Moisturize daily, recommend CeraVe cream  Topical steroids (such as triamcinolone, fluocinolone, fluocinonide, mometasone, clobetasol , halobetasol, betamethasone, hydrocortisone) can cause thinning and lightening of the skin if they are used for too long in the same area. Your physician has selected the right strength medicine for your problem and area affected on the body. Please use your medication only as directed by your physician to prevent side effects.    Return in about 4 months (around 07/18/2024) for Nail recheck.  I, Jill Parcell, CMA, am acting as scribe for Celine Collard, MD.   Documentation: I have reviewed the above documentation for accuracy and completeness, and I agree with the above.  Celine Collard, MD

## 2024-03-17 NOTE — Patient Instructions (Addendum)
 Start mometasone cream twice a day to affected area on thumb M-F Moisturize daily, recommend CeraVe cream  Topical steroids (such as triamcinolone, fluocinolone, fluocinonide, mometasone, clobetasol , halobetasol, betamethasone, hydrocortisone) can cause thinning and lightening of the skin if they are used for too long in the same area. Your physician has selected the right strength medicine for your problem and area affected on the body. Please use your medication only as directed by your physician to prevent side effects.       Due to recent changes in healthcare laws, you may see results of your pathology and/or laboratory studies on MyChart before the doctors have had a chance to review them. We understand that in some cases there may be results that are confusing or concerning to you. Please understand that not all results are received at the same time and often the doctors may need to interpret multiple results in order to provide you with the best plan of care or course of treatment. Therefore, we ask that you please give us  2 business days to thoroughly review all your results before contacting the office for clarification. Should we see a critical lab result, you will be contacted sooner.   If You Need Anything After Your Visit  If you have any questions or concerns for your doctor, please call our main line at [phone] and press option 4 to reach your doctor's medical assistant. If no one answers, please leave a voicemail as directed and we will return your call as soon as possible. Messages left after 4 pm will be answered the following business day.   You may also send us  a message via MyChart. We typically respond to MyChart messages within 1-2 business days.  For prescription refills, please ask your pharmacy to contact our office. Our fax number is [phone].  If you have an urgent issue when the clinic is closed that cannot wait until the next business day, you can page your  doctor at the number below.    Please note that while we do our best to be available for urgent issues outside of office hours, we are not available 24/7.   If you have an urgent issue and are unable to reach us , you may choose to seek medical care at your doctor's office, retail clinic, urgent care center, or emergency room.  If you have a medical emergency, please immediately call 911 or go to the emergency department.  Pager Numbers  - Dr. Bary Likes: [phone]  - Dr. Annette Barters: [phone]  - Dr. Felipe Horton: [phone]   In the event of inclement weather, please call our main line at [phone] for an update on the status of any delays or closures.  Dermatology Medication Tips: Please keep the boxes that topical medications come in in order to help keep track of the instructions about where and how to use these. Pharmacies typically print the medication instructions only on the boxes and not directly on the medication tubes.   If your medication is too expensive, please contact our office at [phone] option 4 or send us  a message through MyChart.   We are unable to tell what your co-pay for medications will be in advance as this is different depending on your insurance coverage. However, we may be able to find a substitute medication at lower cost or fill out paperwork to get insurance to cover a needed medication.   If a prior authorization is required to get your medication covered by your insurance company, please allow us   1-2 business days to complete this process.  Drug prices often vary depending on where the prescription is filled and some pharmacies may offer cheaper prices.  The website www.goodrx.com contains coupons for medications through different pharmacies. The prices here do not account for what the cost may be with help from insurance (it may be cheaper with your insurance), but the website can give you the price if you did not use any insurance.  - You can print  the associated coupon and take it with your prescription to the pharmacy.  - You may also stop by our office during regular business hours and pick up a GoodRx coupon card.  - If you need your prescription sent electronically to a different pharmacy, notify our office through [facility] or by phone at [phone] option 4.     Si Usted Necesita Algo Despus de Su Visita  Tambin puede enviarnos un mensaje a travs de Clinical cytogeneticist. Por lo general respondemos a los mensajes de MyChart en el transcurso de 1 a 2 das hbiles.  Para renovar recetas, por favor pida a su farmacia que se ponga en contacto con nuestra oficina. Franz Jacks de fax es [place] [phone].  Si tiene un asunto urgente cuando la clnica est cerrada y que no puede esperar hasta el siguiente da hbil, puede llamar/localizar a su doctor(a) al nmero que aparece a continuacin.   Por favor, tenga en cuenta que aunque hacemos todo lo posible para estar disponibles para asuntos urgentes fuera del horario de [place], no estamos disponibles las 24 horas del da, los 7 [address]  [address] de la [place].   Si tiene un problema urgente y no puede comunicarse con nosotros, puede optar por buscar atencin mdica  en el consultorio de su doctor(a), en una clnica privada, en un centro de atencin urgente o en una sala de emergencias.  Si tiene Engineer, drilling, por favor llame inmediatamente al 911 o vaya a la sala de emergencias.  Nmeros de bper  - Dr. Bary Likes: [phone]  - Dra. Annette Barters: [phone]  - Dr. Felipe Horton: [phone]   En caso de inclemencias del tiempo, por favor llame a Lajuan Pila principal al [phone] para una actualizacin sobre el [place] de cualquier retraso o cierre.  Consejos para la medicacin en dermatologa: Por favor, guarde las cajas en las que vienen los medicamentos de uso tpico para ayudarle a seguir las instrucciones sobre dnde y cmo usarlos. Las farmacias generalmente imprimen las  instrucciones del medicamento slo en las cajas y no directamente en los tubos del [place].   Si su medicamento es muy caro, por favor, pngase en contacto con Bettyjane Brunet llamando al [phone] y presione la opcin 4 o envenos un mensaje a travs de Clinical cytogeneticist.   No podemos decirle cul ser su copago por los medicamentos por adelantado ya que esto es diferente dependiendo de la cobertura de su seguro. Sin embargo, es posible que podamos encontrar un medicamento sustituto a Audiological scientist un formulario para que el seguro cubra el medicamento que se considera necesario.   Si se requiere una autorizacin previa para que su compaa de seguros [place] su medicamento, por favor permtanos de 1 a 2 das hbiles para completar este proceso.  Los precios de los medicamentos varan con frecuencia dependiendo del Environmental consultant de dnde se surte la receta y alguna farmacias pueden ofrecer precios ms baratos.  El sitio web www.goodrx.com tiene cupones para medicamentos de Health and safety inspector. Los precios aqu no tienen en cuenta lo que podra  costar con la ayuda del seguro (puede ser ms barato con su seguro), pero el sitio web puede darle el precio si no Visual merchandiser.  - Puede imprimir el cupn correspondiente y llevarlo con su receta a la farmacia.  - Tambin puede pasar por nuestra oficina durante el horario de atencin regular y Education officer, museum una tarjeta de cupones de GoodRx.  - Si necesita que su receta se enve electrnicamente a una farmacia diferente, informe a nuestra oficina a travs de MyChart de [place] o por telfono llamando al [phone] y presione la opcin 4.

## 2024-07-15 ENCOUNTER — Ambulatory Visit: Admitting: Dermatology

## 2024-07-15 ENCOUNTER — Encounter: Payer: Self-pay | Admitting: Dermatology

## 2024-07-15 DIAGNOSIS — L2489 Irritant contact dermatitis due to other agents: Secondary | ICD-10-CM

## 2024-07-15 DIAGNOSIS — L280 Lichen simplex chronicus: Secondary | ICD-10-CM | POA: Diagnosis not present

## 2024-07-15 DIAGNOSIS — L309 Dermatitis, unspecified: Secondary | ICD-10-CM | POA: Diagnosis not present

## 2024-07-15 DIAGNOSIS — L2089 Other atopic dermatitis: Secondary | ICD-10-CM

## 2024-07-15 DIAGNOSIS — L603 Nail dystrophy: Secondary | ICD-10-CM

## 2024-07-15 DIAGNOSIS — Z79899 Other long term (current) drug therapy: Secondary | ICD-10-CM

## 2024-07-15 DIAGNOSIS — Z7189 Other specified counseling: Secondary | ICD-10-CM

## 2024-07-15 MED ORDER — TACROLIMUS 0.1 % EX OINT: TOPICAL_OINTMENT | Freq: Two times a day (BID) | CUTANEOUS | 0 refills | Status: AC

## 2024-07-15 NOTE — Progress Notes (Unsigned)
   Follow-Up Visit   Subjective  [name] is a 68 y.o. female who presents for the following: abnormal nail growth  Last visit on 03/17/24. Instructed to use Mometasone  cream twice a day to the affected areas on the nails M-F and moisturize daily.  Pt only used one month then stopped.  The following portions of the chart were reviewed this encounter and updated as appropriate: medications, allergies, medical history  Review of Systems:  No other skin or systemic complaints except as noted in HPI or Assessment and Plan.  Objective  Well appearing patient in no apparent distress; mood and affect are within normal limits.  A focused examination was performed of the following areas: L Thumb nail  Relevant exam findings are noted in the Assessment and Plan.    Assessment & Plan   Eczematous reaction with nail dystrophy Pt admits to picking at area. Exam: Scaly peeling skin at medial proximal nail fold.  Much improved compared to previous photo.  Treatment Plan: Prescribing Tacrolimus  0.1 ointment to apply BID Giving sample of Opzelura to use until able to get Tacrolimus   ONU:[id number]:[id number]  Pt advised this will take many months of treatment to improve and may never get back to total normal, but has already improved some.  She must use medication twice daily for long term and avoid rubbing or picking area.   Return in about 4 months (around 11/14/2024) for nail f/u.  I, Gordan Beams, CMA, am acting as scribe for Alm Rhyme, MD.   Documentation: I have reviewed the above documentation for accuracy and completeness, and I agree with the above.  Alm Rhyme, MD

## 2024-08-30 ENCOUNTER — Emergency Department
Admission: EM | Admit: 2024-08-30 | Discharge: 2024-08-30 | Disposition: A | Discharge status: Home / Self Care | Attending: Emergency Medicine | Admitting: Emergency Medicine

## 2024-08-30 ENCOUNTER — Other Ambulatory Visit: Payer: Self-pay

## 2024-08-30 ENCOUNTER — Encounter: Payer: Self-pay | Admitting: Emergency Medicine

## 2024-08-30 DIAGNOSIS — R059 Cough, unspecified: Secondary | ICD-10-CM | POA: Diagnosis present

## 2024-08-30 DIAGNOSIS — J069 Acute upper respiratory infection, unspecified: Secondary | ICD-10-CM | POA: Insufficient documentation

## 2024-08-30 DIAGNOSIS — Z85828 Personal history of other malignant neoplasm of skin: Secondary | ICD-10-CM | POA: Insufficient documentation

## 2024-08-30 LAB — RESP PANEL BY RT-PCR (RSV, FLU A&B, COVID)  RVPGX2
Influenza A by PCR: NEGATIVE
Influenza B by PCR: NEGATIVE
Resp Syncytial Virus by PCR: NEGATIVE
SARS Coronavirus 2 by RT PCR: NEGATIVE

## 2024-08-30 MED ORDER — PREDNISONE 10 MG PO TABS: 30.0000 mg | ORAL_TABLET | Freq: Every day | ORAL | 0 refills | Status: AC

## 2024-08-30 MED ORDER — DOXYCYCLINE HYCLATE 100 MG PO TABS: 100.0000 mg | ORAL_TABLET | Freq: Two times a day (BID) | ORAL | 0 refills | Status: AC

## 2024-08-30 NOTE — ED Provider Notes (Signed)
 [facility] Provider Note    Event Date/Time   First MD Initiated Contact with Patient 08/30/24 1451     (approximate)   History   Otalgia   HPI  [name] is a 68 y.o. female with history of squamous cell carcinoma, hyperlipidemia, tinnitus, anxiety, multiple other chronic medical problems, see past medical history, presents emergency department with complaints of bilateral ear pain, nasal congestion, cough with yellow mucus.  Denies fever or chills.  Denies chest pain or shortness of breath.  Patient states she just feels worse today than she did yesterday.      Physical Exam   Triage Vital Signs: ED Triage Vitals  Encounter Vitals Group     BP 08/30/24 1410 (!) 156/84     Girls Systolic BP Percentile --      Girls Diastolic BP Percentile --      Boys Systolic BP Percentile --      Boys Diastolic BP Percentile --      Pulse Rate 08/30/24 1410 (!) 105     Resp 08/30/24 1410 16     Temp 08/30/24 1410 98.5 F (36.9 C)     Temp Source 08/30/24 1410 Oral     SpO2 08/30/24 1410 97 %     Weight 08/30/24 1411 110 lb 3.7 oz (50 kg)     Height 08/30/24 1411 5' 3 (1.6 m)     Head Circumference --      Peak Flow --      Pain Score 08/30/24 1410 8     Pain Loc --      Pain Education --      Exclude from Growth Chart --     Most recent vital signs: Vitals:   08/30/24 1410  BP: (!) 156/84  Pulse: (!) 105  Resp: 16  Temp: 98.5 F (36.9 C)  SpO2: 97%     General: Awake, no distress.   CV:  Good peripheral perfusion. regular rate and  rhythm Resp:  Normal effort. Lungs CTA Abd:  No distention.   Other:  ENT: TMs clear bilaterally, throat appears to be normal, neck is supple, no lymphadenopathy   ED Results / Procedures / Treatments   Labs (all labs ordered are listed, but only abnormal results are displayed) Labs Reviewed  RESP PANEL BY RT-PCR (RSV, FLU A&B, COVID)  [id number]      EKG     RADIOLOGY     PROCEDURES:   Procedures  Critical Care: No Chief Complaint  Patient presents with   Otalgia      MEDICATIONS ORDERED IN ED: Medications - No data to display   IMPRESSION / MDM / ASSESSMENT AND PLAN / ED COURSE  I reviewed the triage vital signs and the nursing notes.                              Differential diagnosis includes, but is not limited to, COVID, influenza, RSV, CAP, URI  Patient's presentation is most consistent with acute illness / injury with system symptoms.   Respiratory panel reassuring  Patient appears to be well.  Due to the fact she is having yellow mucus we will go ahead and place her on antibiotic.  She placed on Doxy, take Tylenol  and ibuprofen.  Follow-up with her regular doctor if not improving in 2 to 3 days.  Return if worsening.  3-day burst of prednisone  also prescribed get pressure  from off of her face.  She is in agreement treatment plan.  Discharged stable condition.      FINAL CLINICAL IMPRESSION(S) / ED DIAGNOSES   Final diagnoses:  Acute URI     Rx / DC Orders   ED Discharge Orders          Ordered    predniSONE  (DELTASONE ) 10 MG tablet  Daily with breakfast        08/30/24 1525    doxycycline (VIBRA-TABS) 100 MG tablet  2 times daily        08/30/24 1525             Note:  This document was prepared using Dragon voice recognition software and may include unintentional dictation errors.    Gasper Devere ORN, PA-C 08/30/24 1657    Ernest Ronal BRAVO, MD 09/04/24 [phone]

## 2024-08-30 NOTE — ED Triage Notes (Signed)
 Pt endorses ear and sinus pain since yesterday.

## 2024-11-18 ENCOUNTER — Ambulatory Visit: Admitting: Dermatology

## 2024-11-24 ENCOUNTER — Ambulatory Visit: Admitting: Dermatology

## 2024-12-08 ENCOUNTER — Ambulatory Visit: Admitting: Dermatology

## 2024-12-31 ENCOUNTER — Ambulatory Visit: Admitting: Dermatology
